# Patient Record
Sex: Female | Born: 1935 | Race: White | Hispanic: No | Smoking: Never smoker
Health system: Southern US, Community
[De-identification: ages and names within clinical notes are randomized; demographics above are authoritative.]

## PROBLEM LIST (undated history)

## (undated) DIAGNOSIS — Z950 Presence of cardiac pacemaker: Secondary | ICD-10-CM

## (undated) DIAGNOSIS — I10 Essential (primary) hypertension: Secondary | ICD-10-CM

## (undated) DIAGNOSIS — I071 Rheumatic tricuspid insufficiency: Secondary | ICD-10-CM

## (undated) DIAGNOSIS — M1990 Unspecified osteoarthritis, unspecified site: Secondary | ICD-10-CM

## (undated) DIAGNOSIS — F419 Anxiety disorder, unspecified: Secondary | ICD-10-CM

## (undated) DIAGNOSIS — T460X1A Poisoning by cardiac-stimulant glycosides and drugs of similar action, accidental (unintentional), initial encounter: Secondary | ICD-10-CM

## (undated) DIAGNOSIS — I495 Sick sinus syndrome: Secondary | ICD-10-CM

## (undated) DIAGNOSIS — I951 Orthostatic hypotension: Secondary | ICD-10-CM

## (undated) DIAGNOSIS — R609 Edema, unspecified: Secondary | ICD-10-CM

## (undated) DIAGNOSIS — I4891 Unspecified atrial fibrillation: Secondary | ICD-10-CM

## (undated) DIAGNOSIS — J449 Chronic obstructive pulmonary disease, unspecified: Secondary | ICD-10-CM

## (undated) HISTORY — DX: Unspecified atrial fibrillation: I48.91

## (undated) HISTORY — DX: Unspecified osteoarthritis, unspecified site: M19.90

## (undated) HISTORY — DX: Essential (primary) hypertension: I10

## (undated) HISTORY — DX: Presence of cardiac pacemaker: Z95.0

## (undated) HISTORY — PX: TOTAL HIP ARTHROPLASTY: SHX124

## (undated) HISTORY — DX: Sick sinus syndrome: I49.5

## (undated) HISTORY — DX: Rheumatic tricuspid insufficiency: I07.1

## (undated) HISTORY — DX: Edema, unspecified: R60.9

## (undated) HISTORY — DX: Poisoning by cardiac-stimulant glycosides and drugs of similar action, accidental (unintentional), initial encounter: T46.0X1A

## (undated) HISTORY — DX: Orthostatic hypotension: I95.1

## (undated) HISTORY — DX: Anxiety disorder, unspecified: F41.9

## (undated) HISTORY — DX: Chronic obstructive pulmonary disease, unspecified: J44.9

---

## 1998-11-09 ENCOUNTER — Encounter: Payer: Self-pay | Admitting: Orthopaedic Surgery

## 1998-11-09 ENCOUNTER — Inpatient Hospital Stay (HOSPITAL_COMMUNITY): Admission: RE | Admit: 1998-11-09 | Discharge: 1998-11-14 | Payer: Self-pay | Admitting: Orthopaedic Surgery

## 1999-05-02 HISTORY — PX: BACK SURGERY: SHX140

## 1999-07-25 ENCOUNTER — Encounter: Admission: RE | Admit: 1999-07-25 | Discharge: 1999-07-25 | Payer: Self-pay | Admitting: Orthopaedic Surgery

## 1999-07-25 ENCOUNTER — Encounter: Payer: Self-pay | Admitting: Orthopaedic Surgery

## 2006-05-01 HISTORY — PX: PACEMAKER INSERTION: SHX728

## 2006-10-10 ENCOUNTER — Encounter: Admission: RE | Admit: 2006-10-10 | Discharge: 2006-10-10 | Payer: Self-pay | Admitting: Internal Medicine

## 2006-10-31 ENCOUNTER — Ambulatory Visit: Payer: Self-pay | Admitting: Cardiology

## 2006-10-31 ENCOUNTER — Inpatient Hospital Stay (HOSPITAL_COMMUNITY): Admission: EM | Admit: 2006-10-31 | Discharge: 2006-11-08 | Payer: Self-pay | Admitting: Emergency Medicine

## 2006-11-01 ENCOUNTER — Encounter (INDEPENDENT_AMBULATORY_CARE_PROVIDER_SITE_OTHER): Payer: Self-pay | Admitting: Internal Medicine

## 2006-11-10 ENCOUNTER — Emergency Department (HOSPITAL_COMMUNITY): Admission: EM | Admit: 2006-11-10 | Discharge: 2006-11-10 | Payer: Self-pay | Admitting: Emergency Medicine

## 2006-11-26 ENCOUNTER — Ambulatory Visit: Payer: Self-pay | Admitting: Cardiology

## 2006-11-26 ENCOUNTER — Ambulatory Visit: Payer: Self-pay

## 2006-12-06 ENCOUNTER — Ambulatory Visit: Payer: Self-pay | Admitting: Cardiology

## 2007-01-18 ENCOUNTER — Ambulatory Visit: Payer: Self-pay | Admitting: Cardiology

## 2007-01-23 ENCOUNTER — Ambulatory Visit: Payer: Self-pay | Admitting: Cardiology

## 2007-02-12 ENCOUNTER — Ambulatory Visit: Payer: Self-pay | Admitting: Cardiology

## 2007-02-26 ENCOUNTER — Ambulatory Visit: Payer: Self-pay | Admitting: Internal Medicine

## 2007-03-13 ENCOUNTER — Ambulatory Visit: Payer: Self-pay | Admitting: Cardiology

## 2007-03-13 ENCOUNTER — Ambulatory Visit: Payer: Self-pay | Admitting: Internal Medicine

## 2007-03-13 LAB — CONVERTED CEMR LAB
CO2: 31 meq/L (ref 19–32)
Creatinine, Ser: 1 mg/dL (ref 0.4–1.2)
Potassium: 4.7 meq/L (ref 3.5–5.1)
Sodium: 136 meq/L (ref 135–145)

## 2007-11-05 ENCOUNTER — Ambulatory Visit: Payer: Self-pay | Admitting: Internal Medicine

## 2008-02-02 ENCOUNTER — Encounter: Payer: Self-pay | Admitting: Cardiology

## 2008-02-03 ENCOUNTER — Ambulatory Visit: Payer: Self-pay | Admitting: Cardiology

## 2008-02-05 ENCOUNTER — Ambulatory Visit: Payer: Self-pay | Admitting: Internal Medicine

## 2008-05-06 ENCOUNTER — Ambulatory Visit: Payer: Self-pay | Admitting: Internal Medicine

## 2008-05-29 ENCOUNTER — Encounter (INDEPENDENT_AMBULATORY_CARE_PROVIDER_SITE_OTHER): Payer: Self-pay | Admitting: *Deleted

## 2008-08-05 ENCOUNTER — Ambulatory Visit: Payer: Self-pay | Admitting: Internal Medicine

## 2008-10-20 ENCOUNTER — Telehealth (INDEPENDENT_AMBULATORY_CARE_PROVIDER_SITE_OTHER): Payer: Self-pay | Admitting: *Deleted

## 2008-11-19 DIAGNOSIS — I4891 Unspecified atrial fibrillation: Secondary | ICD-10-CM | POA: Insufficient documentation

## 2008-11-19 DIAGNOSIS — M159 Polyosteoarthritis, unspecified: Secondary | ICD-10-CM

## 2008-11-19 DIAGNOSIS — J4489 Other specified chronic obstructive pulmonary disease: Secondary | ICD-10-CM | POA: Insufficient documentation

## 2008-11-19 DIAGNOSIS — J449 Chronic obstructive pulmonary disease, unspecified: Secondary | ICD-10-CM

## 2008-11-22 IMAGING — US US RENAL
1 series · 14 of 25 positions shown · non-contrast
Comparison: None.

CLINICAL DATA: Recurrent bladder infections.
RENAL/URINARY TRACT ULTRASOUND:
TECHNIQUE: Complete ultrasound of the urinary tract was performed including evaluation of the kidney, renal collecting systems, and urinary bladder.

[Series 1: us renal · 0.22mm/px · 14 of 34 slices shown]
[im 1/34]
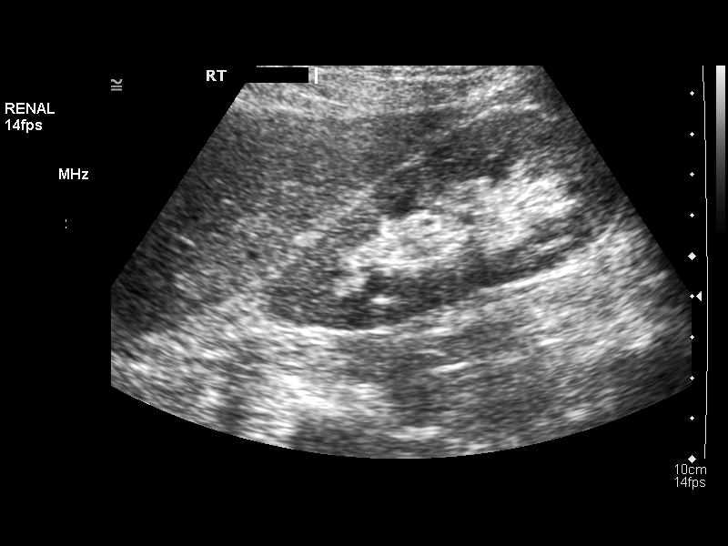
[im 3/34]
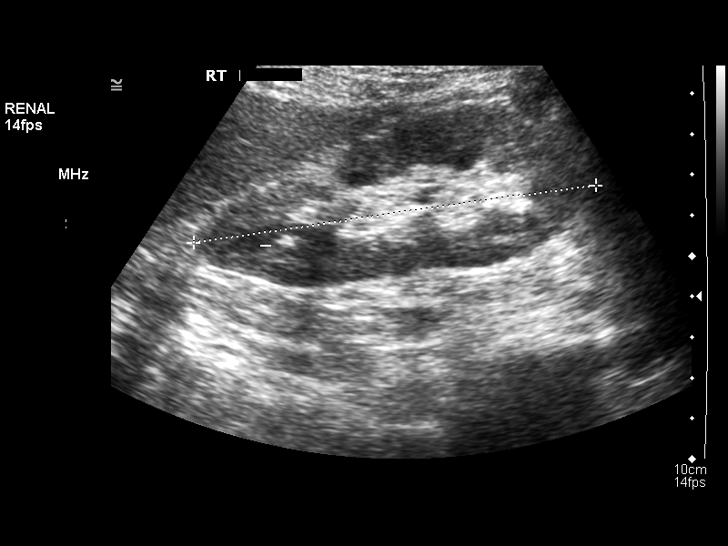
[im 6/34]
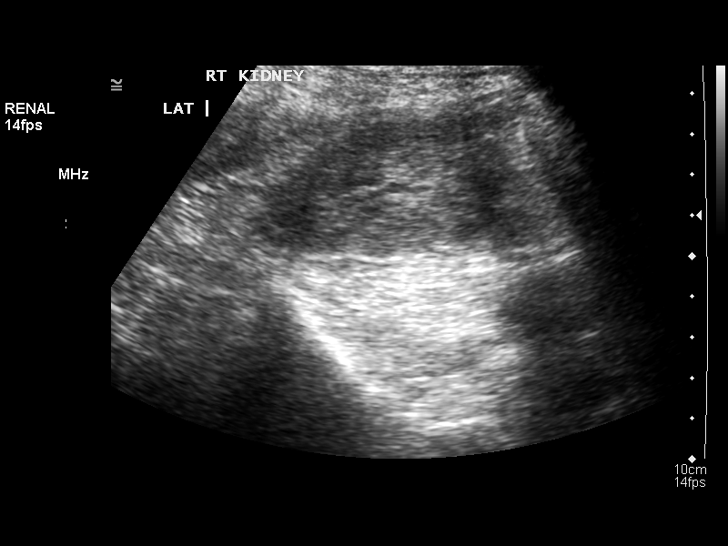
[im 9/34]
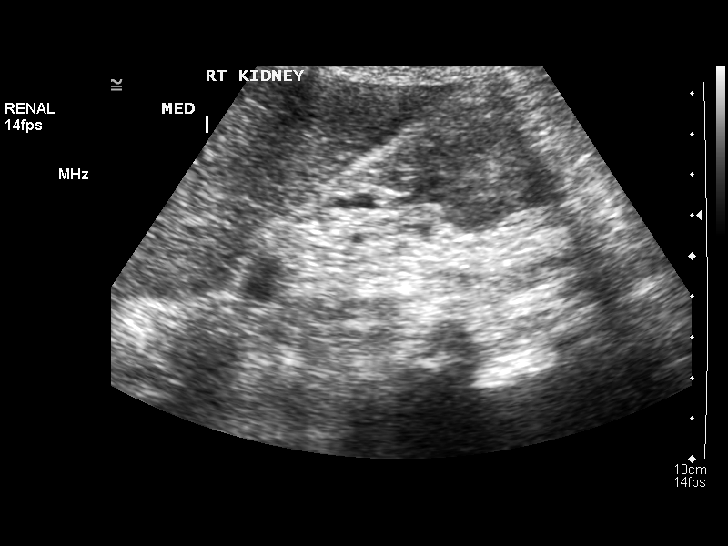
[im 12/34]
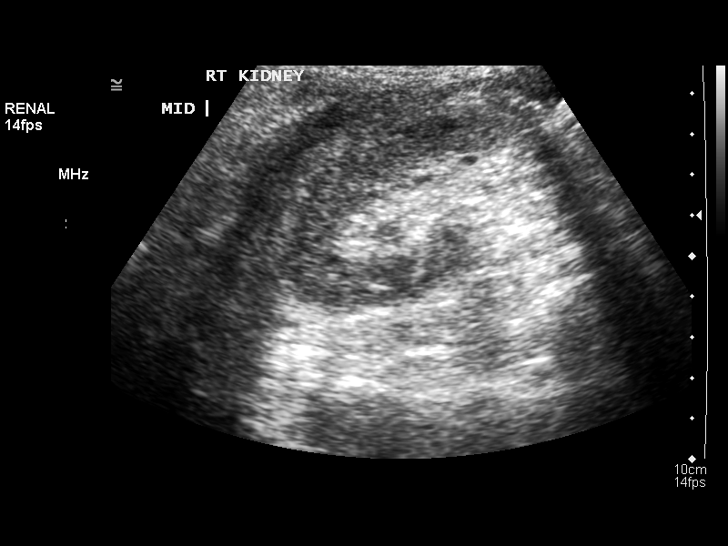
[im 13/34]
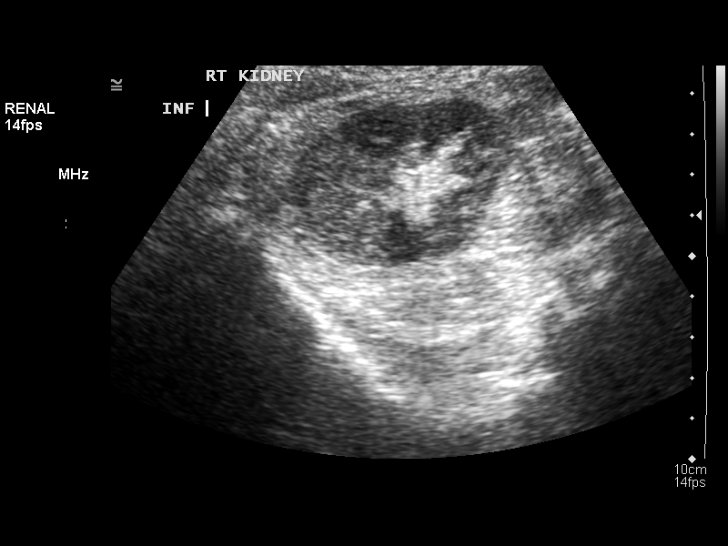
[im 16/34]
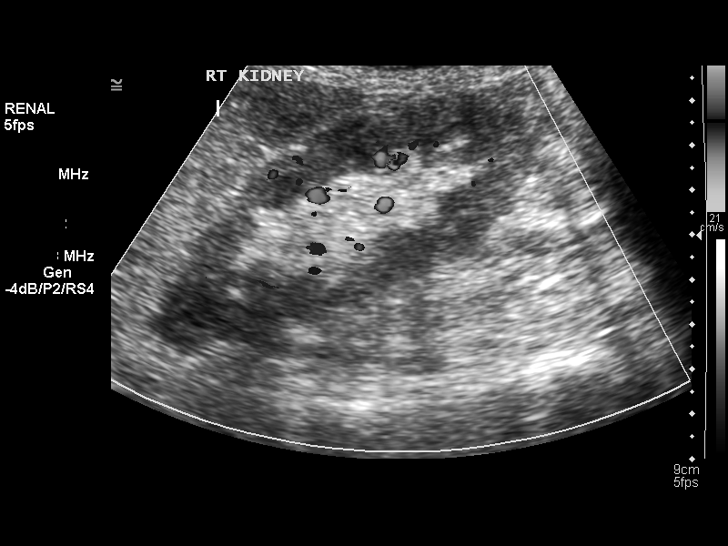
[im 18/34]
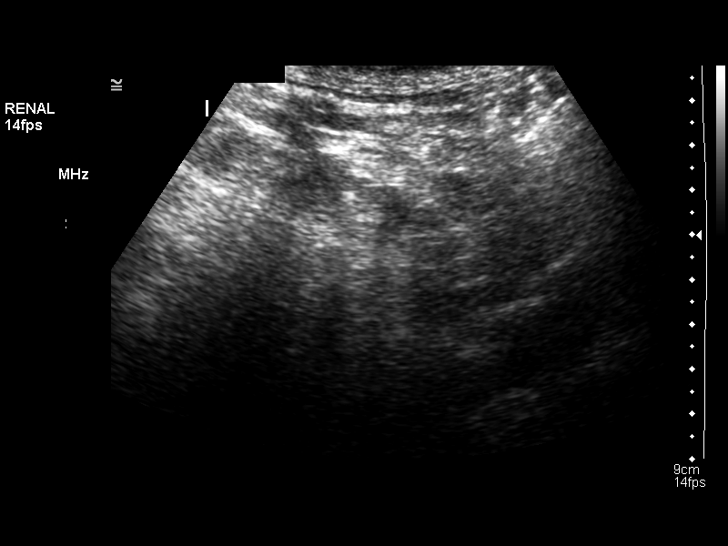
[im 21/34]
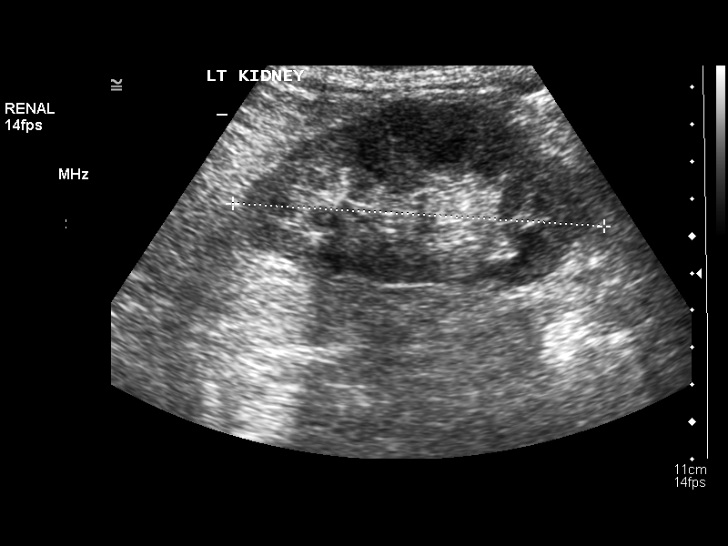
[im 23/34]
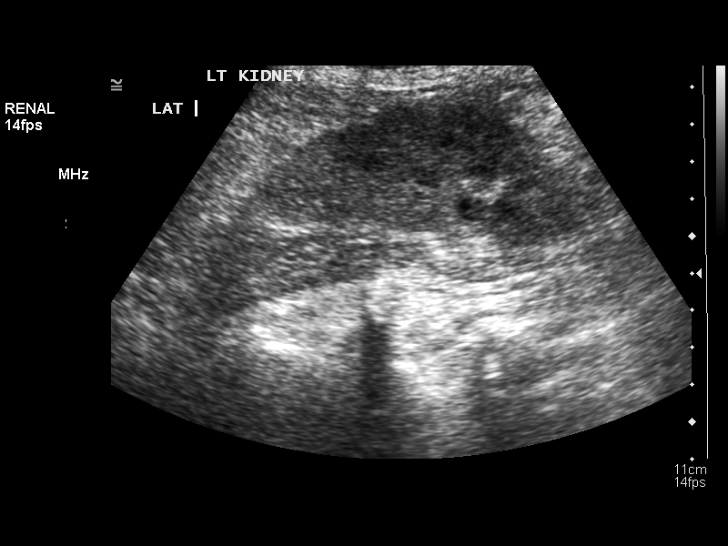
[im 25/34]
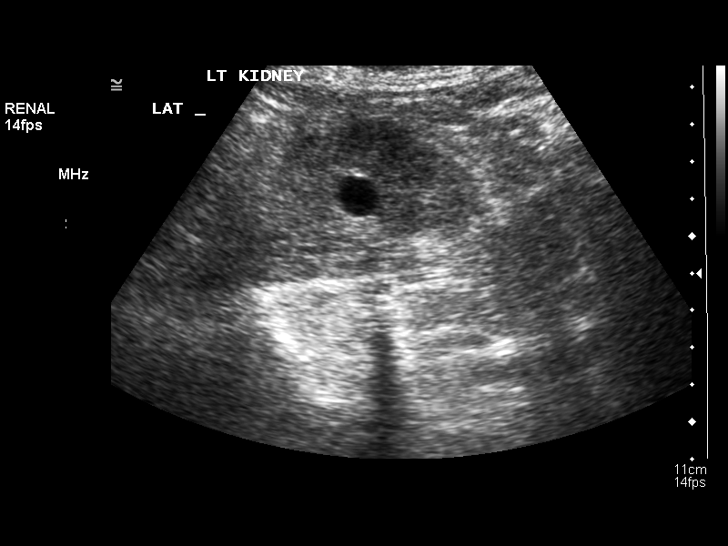
[im 28/34]
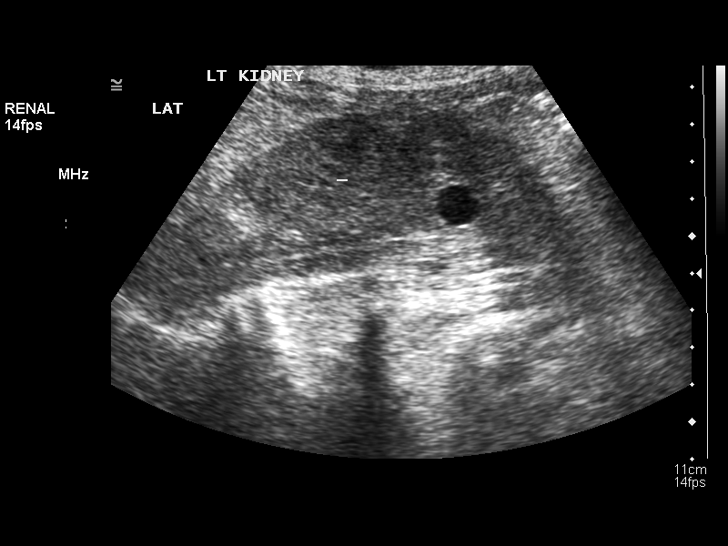
[im 31/34]
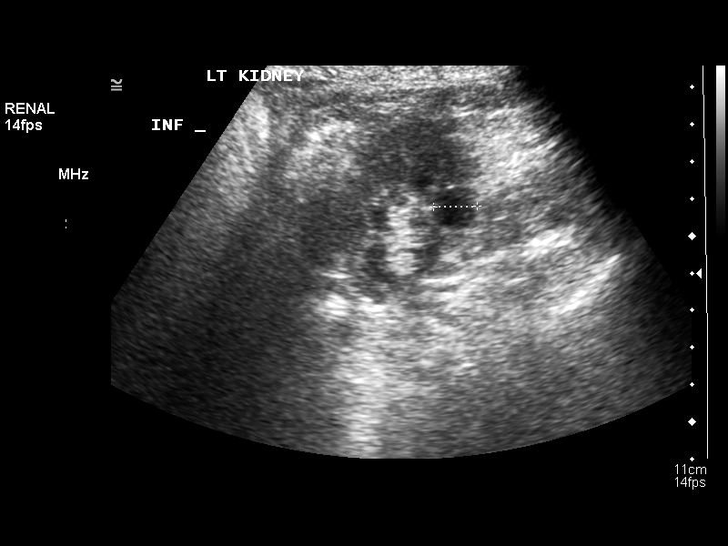
[im 34/34]
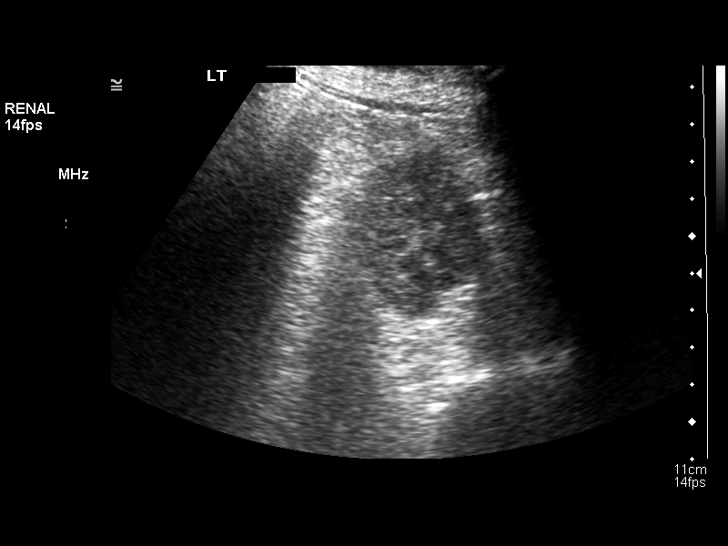

[14 of 25 positions shown; findings below may reference images not displayed]

FINDINGS: Each kidney measures 10 cm in length without hydronephrosis.  Right kidney with two cysts.  Along the upper pole region, a cyst measures 2.2 x 1.6 x 1.4 cm.  Along the lower pole region, cyst measures 1.1 x 1.7 x 1.5 cm.  Left lower pole cyst measures 1.4 x 1.2 x 1.2 cm. 
Bladder is decompressed as the patient had to void prior to the exam and evaluation of the bladder is limited.
IMPRESSION: No evidence of hydronephrosis involving either kidney.  Bilateral renal cysts are noted, as described above.

## 2008-12-23 IMAGING — CT CT HEAD W/O CM
1 of 2 series · 13 of 30 positions shown, 17 images · IV contrast (agent unspecified)
Comparison: none

CLINICAL DATA: Atrial fibrillation. Dizziness. Pacemaker. 
HEAD CT WITHOUT CONTRAST:
TECHNIQUE: Contiguous axial images were obtained from the base of the skull through the vertex according to standard protocol without contrast.

[Series 2: brain · axial · 0.47mm/px · z∈[+137,+265]mm · 13 of 28 slices shown, 17 images]
[im 2/28  brain]
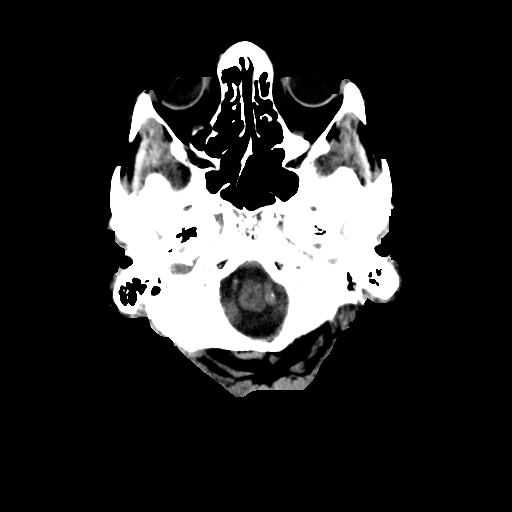
[im 2/28  bone]
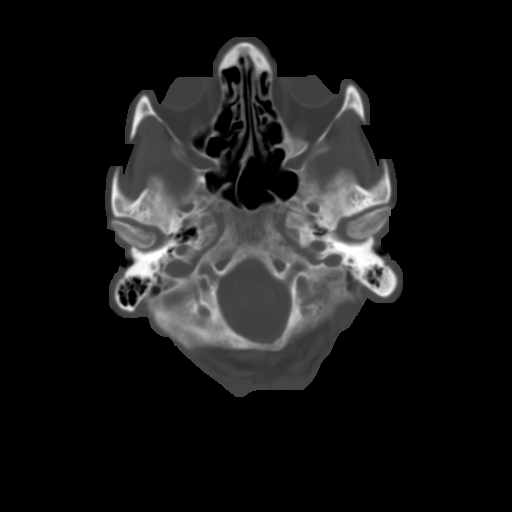
[im 4/28  brain]
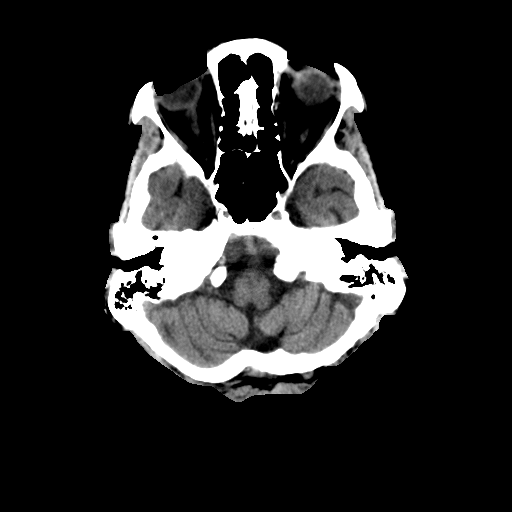
[im 6/28  brain]
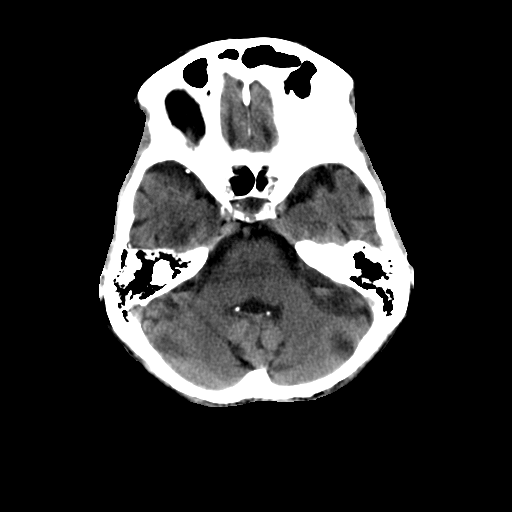
[im 8/28  brain]
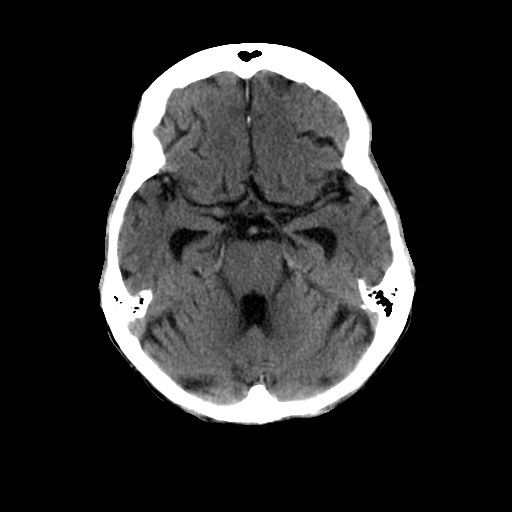
[im 10/28  brain]
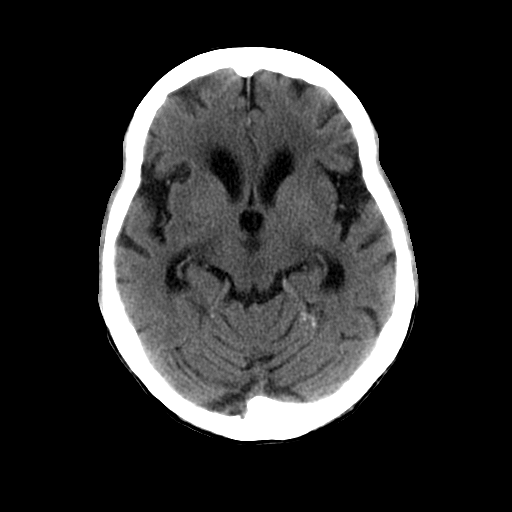
[im 10/28  bone]
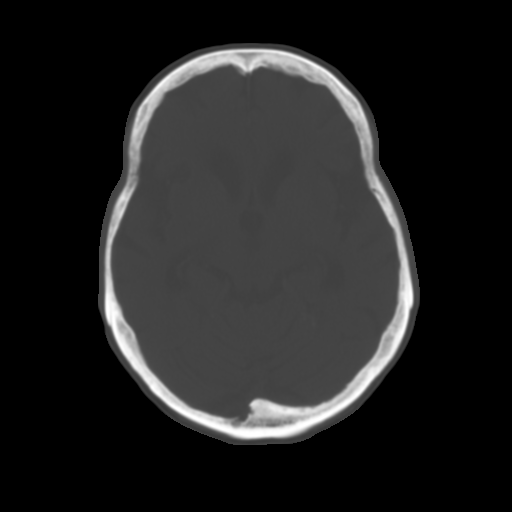
[im 12/28  brain]
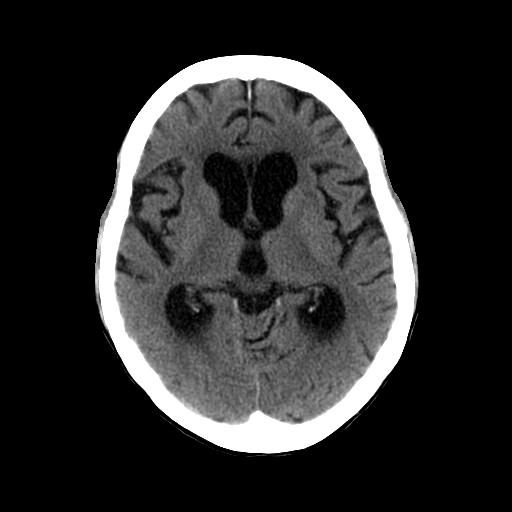
[im 14/28  brain]
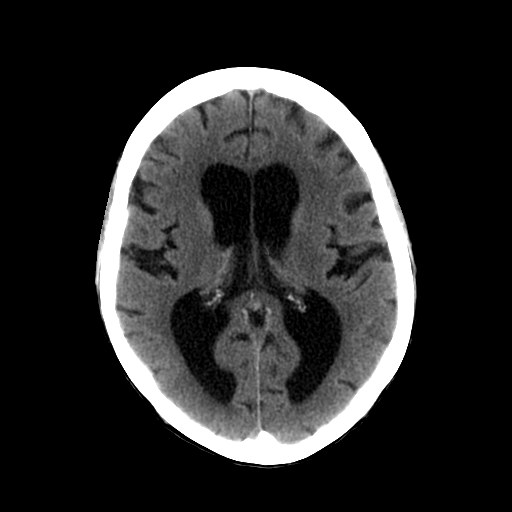
[im 16/28  brain]
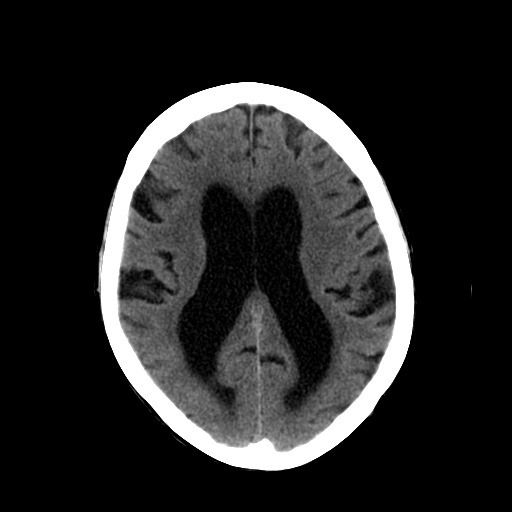
[im 18/28  brain]
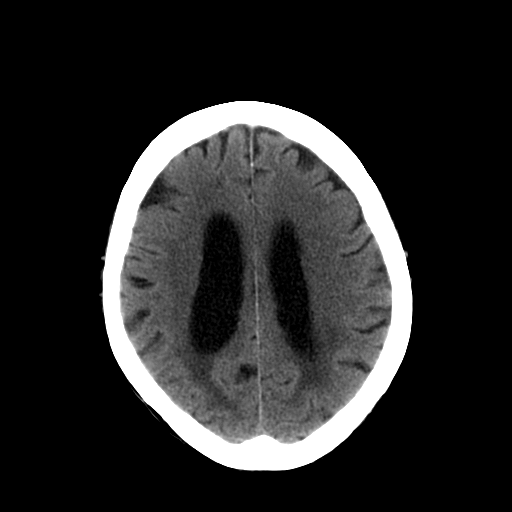
[im 18/28  bone]
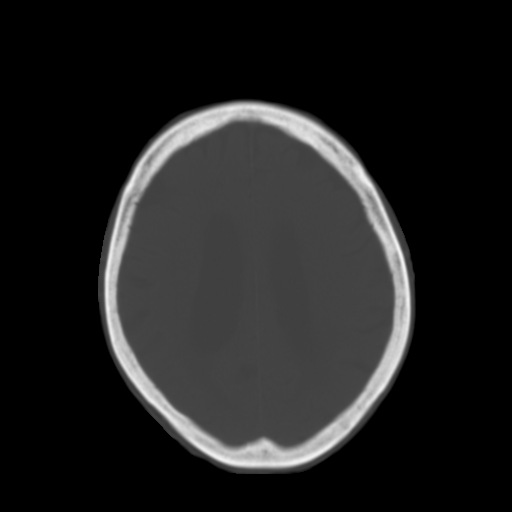
[im 20/28  brain]
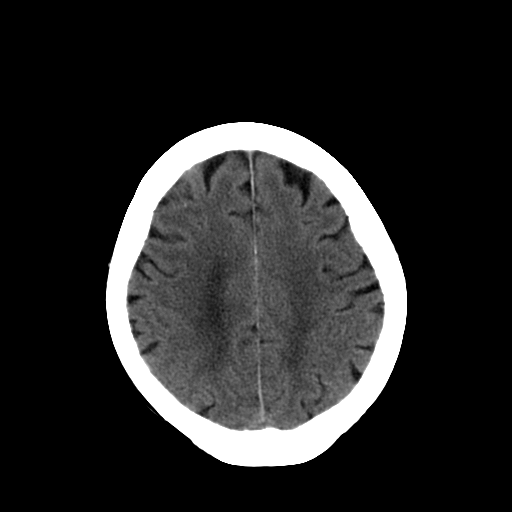
[im 22/28  brain]
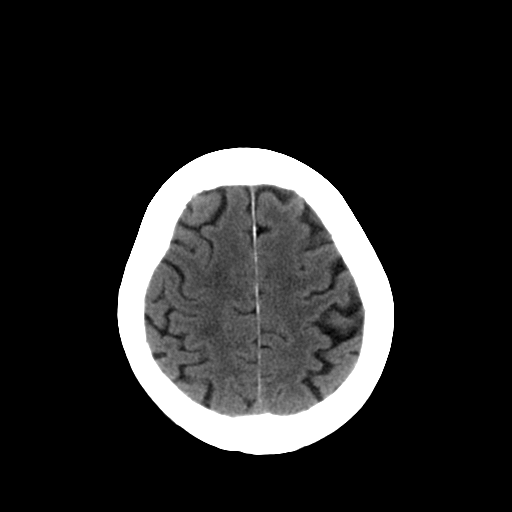
[im 24/28  brain]
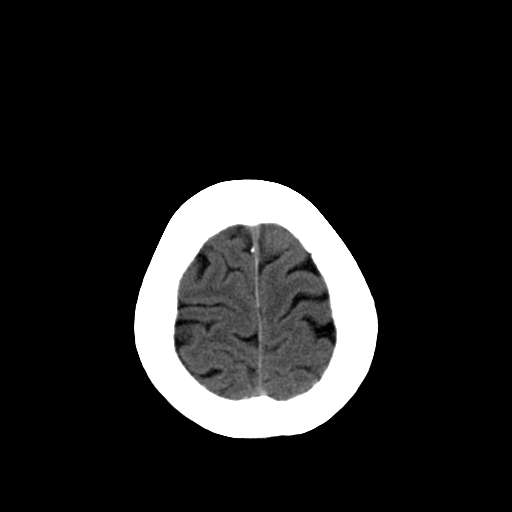
[im 26/28  brain]
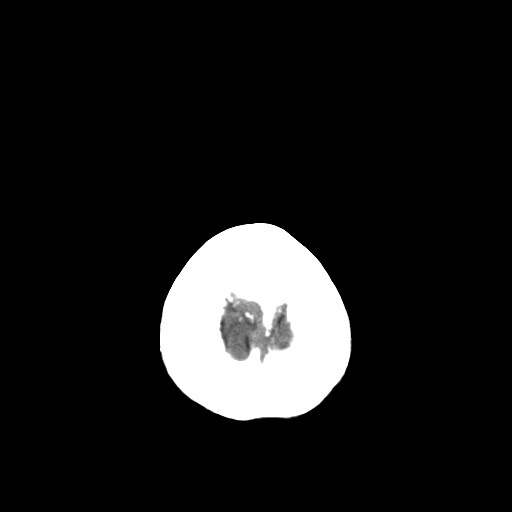
[im 26/28  bone]
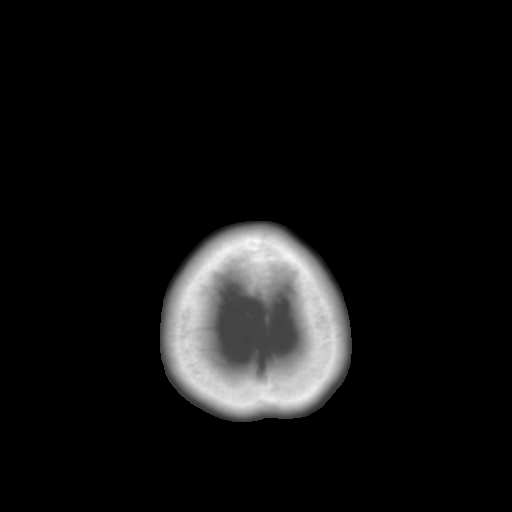

[13 of 30 positions shown; findings below may reference images not displayed]

FINDINGS: As noted previously, there is diffuse ventricular dilatation with enlarged temporal horns. Prominence of the cerebral sulci, fissures, and basilar cisterns and also the cerebellar folia suggesting that the ventricular dilatation is due to atrophy. I cannot exclude the possibility of normal pressure hydrocephalus. Findings compatible with chronic small vessel disease of the cerebral deep white matter mainly involving the posterior parietal lobes. Negative for hemorrhage, mass or acute infarction.
IMPRESSION: Diffuse ventriculomegaly-see comments above. No acute findings. Chronic small vessel disease of the cerebral white matter. No interval change.

## 2008-12-28 ENCOUNTER — Encounter: Payer: Self-pay | Admitting: Cardiology

## 2008-12-28 DIAGNOSIS — R609 Edema, unspecified: Secondary | ICD-10-CM

## 2008-12-28 DIAGNOSIS — Z950 Presence of cardiac pacemaker: Secondary | ICD-10-CM

## 2008-12-28 DIAGNOSIS — I10 Essential (primary) hypertension: Secondary | ICD-10-CM | POA: Insufficient documentation

## 2008-12-29 ENCOUNTER — Ambulatory Visit: Payer: Self-pay | Admitting: Cardiology

## 2009-01-26 ENCOUNTER — Ambulatory Visit: Payer: Self-pay | Admitting: Internal Medicine

## 2009-04-28 ENCOUNTER — Ambulatory Visit: Payer: Self-pay | Admitting: Internal Medicine

## 2009-05-06 ENCOUNTER — Encounter: Payer: Self-pay | Admitting: Internal Medicine

## 2009-05-20 ENCOUNTER — Telehealth: Payer: Self-pay | Admitting: Internal Medicine

## 2009-07-28 ENCOUNTER — Ambulatory Visit: Payer: Self-pay | Admitting: Internal Medicine

## 2009-07-29 ENCOUNTER — Encounter: Payer: Self-pay | Admitting: Internal Medicine

## 2009-08-02 ENCOUNTER — Encounter: Payer: Self-pay | Admitting: Internal Medicine

## 2009-10-27 ENCOUNTER — Ambulatory Visit: Payer: Self-pay | Admitting: Internal Medicine

## 2009-11-17 ENCOUNTER — Encounter: Payer: Self-pay | Admitting: Internal Medicine

## 2010-01-05 ENCOUNTER — Ambulatory Visit: Payer: Self-pay | Admitting: Cardiology

## 2010-01-10 ENCOUNTER — Telehealth: Payer: Self-pay | Admitting: Cardiology

## 2010-04-07 ENCOUNTER — Ambulatory Visit: Payer: Self-pay | Admitting: Internal Medicine

## 2010-04-15 ENCOUNTER — Encounter: Payer: Self-pay | Admitting: Internal Medicine

## 2010-05-29 LAB — CONVERTED CEMR LAB
Basophils Absolute: 0 10*3/uL (ref 0.0–0.1)
CO2: 27 meq/L (ref 19–32)
Calcium: 9.5 mg/dL (ref 8.4–10.5)
Creatinine, Ser: 1.1 mg/dL (ref 0.4–1.2)
Eosinophils Absolute: 0 10*3/uL (ref 0.0–0.7)
Glucose, Bld: 112 mg/dL — ABNORMAL HIGH (ref 70–99)
Lymphocytes Relative: 17.2 % (ref 12.0–46.0)
MCHC: 34.1 g/dL (ref 30.0–36.0)
Neutrophils Relative %: 71.8 % (ref 43.0–77.0)
RDW: 14.9 % — ABNORMAL HIGH (ref 11.5–14.6)

## 2010-05-31 NOTE — Letter (Signed)
Summary: Remote Device Check  [employer], Main Office  [address]   [place], [place] [address]   Phone: [phone]  Fax: [phone]     August 02, 2009 MRN: [id number]   [facility] [address] [place], [place]  [address]   Dear [name],   Your remote transmission was recieved and reviewed by your physician.  All diagnostics were within normal limits for you.  __X___Your next transmission is scheduled for:  October 27, 2009.  Please transmit at any time this day.  If you have a wireless device your transmission will be sent automatically.     Sincerely,  Proofreader

## 2010-05-31 NOTE — Letter (Signed)
Summary: Remote Device Check  [employer], Main Office  [address]   [place], [place] [address]   Phone: [phone]  Fax: [phone]     May 06, 2009 MRN: [id number]   [facility] [address] [place], [place]  [address]   Dear Ms. Whorley,   Your remote transmission was recieved and reviewed by your physician.  All diagnostics were within normal limits for you.  __X___Your next transmission is scheduled for:   July 28, 2009.  Please transmit at any time this day.  If you have a wireless device your transmission will be sent automatically.     Sincerely,  Proofreader

## 2010-05-31 NOTE — Progress Notes (Signed)
Summary: re bp readings  Phone Note Call from Patient   Caller: Daughter Brandi [name] [phone] Reason for Call: Talk to Nurse Summary of Call: pt was to have bp readings called to nurse today-machine is broken-wanted to talk with nurse-pls call [phone] dtr Brandi [name] Initial call taken by: Glynda Jaeger,  January 10, 2010 4:42 PM  Follow-up for Phone Call        spoke w/Jackie she states pt has been upset since she was told to check her BP at home and every time daughter has tried to check it pt has became very agitated and upset and machine wouldn't work properly, pt has stated she didn't want it checked anymore and has refused to allow daughter to check it.  She does state pt has seemed to be ok and hasn't compained of anything, she does have an upcoming appt w/Dr Graciela Husbands advised we would check her BP at that time [name], RN  January 10, 2010 5:28 PM

## 2010-05-31 NOTE — Cardiovascular Report (Signed)
Summary: Office Visit Remote   Office Visit Remote   Imported By: Roderic Ovens 08/10/2009 16:41:20  _____________________________________________________________________  External Attachment:    Type:   Image     Comment:   External Document

## 2010-05-31 NOTE — Progress Notes (Signed)
Summary: dose pt need pre meds for dental appt  Phone Note Call from Patient Call back at Home Phone [phone]   Caller: Daughter / Olegario Messier Reason for Call: Talk to Nurse Summary of Call: pt has dental appt Monday at 10:15 and needs to know if patient needs to be medicated Initial call taken by: Omer Jack,  May 20, 2009 9:01 AM  Follow-up for Phone Call        pt does not need antibiotics, pts daughter is aware [name], RN  May 20, 2009 9:31 AM

## 2010-05-31 NOTE — Cardiovascular Report (Signed)
Summary: Office Visit Remote   Office Visit Remote   Imported By: Roderic Ovens 05/12/2009 14:56:34  _____________________________________________________________________  External Attachment:    Type:   Image     Comment:   External Document

## 2010-05-31 NOTE — Letter (Signed)
Summary: Remote Device Check  [employer], Main Office  [address]   [place], [place] [address]   Phone: [phone]  Fax: [phone]     November 17, 2009 MRN: [id number]   [facility] [address] [place], [place]  [address]   Dear Ms. Reth,   Your remote transmission was recieved and reviewed by your physician.  All diagnostics were within normal limits for you.   ___X___Your next office visit is scheduled for: September with Dr Graciela Husbands. Please call our office to schedule an appointment.    Sincerely,  Vella Kohler

## 2010-05-31 NOTE — Assessment & Plan Note (Signed)
Summary: f1y   Visit Type:  Follow-up Visit Primary Provider:  None  CC:  FLUID.  History of Present Illness: The patient is seen for cardiology followup.  She has known chronic atrial fibrillation.  There is a pacemaker in place.  This was assessed for only one year ago.  She has a history of some chronic edema with question of some relationship to Norvasc.  She does take a small dose of diuretics.  Recently she had not received any diuretics and she had some increased swelling.  She then had some fluid leak from her right leg.  The family was concerned and she is here for followup.  Fluid discharge stopped and she is stable now.  With a few doses of Lasix her legs are back to normal.  She's not having chest pain or shortness of breath.  Current Medications (verified): 1)  Alprazolam 0.5 Mg Tabs (Alprazolam) .... Take One Tablet At Bedtime 2)  Amlodipine Besylate 5 Mg Tabs (Amlodipine Besylate) .... Take 1 Tablet By Mouth Once A Day 3)  Furosemide 20 Mg Tabs (Furosemide) .... Take 1/2 Tablet By Mouth Once A Day 4)  Metoprolol Tartrate 100 Mg Tabs (Metoprolol Tartrate) .... Take 1 Tablet By Mouth Twice A Day  Allergies (verified): 1)  ! Sulfa 2)  Codeine Phosphate (Codeine Phosphate) 3)  Penicillin V Potassium (Penicillin V Potassium)  Past History:  Past Medical History: Atrial fibrillation-permanent Coumadin    refused repeatedly in the past Pacemaker-Medtronic Adapta.Marland KitchenMarland KitchenJuly, 2008...brady-tachy....syncope  (DR.KLEIN) Digoxin toxicity osteoarthritis Anxiety Hypertension/orthostasis COPD..... biapical pleural thickening  EF  70%.... echo... July, 2008 Tricuspid regurgitation    moderate.... echo... July, 2008 Edema....chronic...Marland Kitchen?? Norvasc?? fluid leakage from right leg with increased edema..... September, 2011  Review of Systems       Patient denies fever, chills, headache, sweats, rash, change in vision, change in hearing, chest pain, cough, nausea vomiting, urinary  symptoms.  All other systems are reviewed and are negative.  Vital Signs:  Patient profile:   77 year old female Height:      69 inches Weight:      160 pounds Pulse rate:   71 / minute BP sitting:   167 / 98  (left arm) Cuff size:   regular  Vitals Entered By: Burnett Kanaris, CNA (January 05, 2010 9:02 AM)  Physical Exam  General:  patient is stable today.  She is sitting in a wheelchair. Head:  head is atraumatic. Eyes:  no xanthelasma. Neck:  no jugular venous distention. Chest Wall:  no chest wall tenderness. Lungs:  lungs are clear.  Respiratory effort is nonlabored. Heart:  cardiac exam reveals an S1-S2.  There is no clicks or significant murmurs.  The rhythm is irregularly irregular. Abdomen:  abdomen is soft. Msk:  no musculoskeletal deformities. Extremities:  trace peripheral edema in the lower extremities. Skin:  no skin rash. Psych:  patient is oriented to person time and place.  Affect is normal.  She is here with 2 daughters.   PPM Specifications Following MD:  Sherryl Manges, MD     PPM Vendor:  Medtronic     PPM Model Number:  [id number]     PPM Serial Number:  [id number] PPM DOI:  11/06/2006     PPM Implanting MD:  Sherryl Manges, MD  Lead 1    Location: RV     DOI: 11/06/2006     Model #: [id number]     Serial #: [id number]     Status: active  Magnet  Response Rate:  BOL 85 ERI 65  Indications:  Tachy-brady syndrome   PPM Follow Up Pacer Dependent:  No      Episodes Coumadin:  No  Parameters Mode:  VVIR     Lower Rate Limit:  60     Upper Rate Limit:  130  Impression & Recommendations:  Problem # 1:  * COUMADIN.... REFUSED REPEATEDLY The patient has atrial fibrillation.  She has repeatedly refused Coumadin over the years.  Problem # 2:  EDEMA (ICD-782.3)  The patient has some chronic edema in her legs.  More recently she had some increased when her diuretic wasn't given.  She then had a leakage of fluid from her right leg.  This has stabilized and does not  represent a significant problem.  She will stay on Lasix now 3 days weekly.  Orders: TLB-BMP (Basic Metabolic Panel-BMET) (80048-METABOL) TLB-CBC Platelet - w/Differential (85025-CBCD)  Problem # 3:  HYPERTENSION (ICD-401.9)  Her updated medication list for this problem includes:    Amlodipine Besylate 5 Mg Tabs (Amlodipine besylate) .Marland Kitchen... Take 1 tablet by mouth once a day    Furosemide 20 Mg Tabs (Furosemide) .Marland Kitchen... Take 1/2 tablet by mouth once a day    Metoprolol Tartrate 100 Mg Tabs (Metoprolol tartrate) .Marland Kitchen... Take 1 tablet by mouth twice a day Blood pressure is elevated today.  We'll arrange for blood pressure followup.  If her pressure remains elevated we will either try to increase her amlodipine again or start another medication.  There was question of edema from the higher dose amlodipine before.  Orders: TLB-BMP (Basic Metabolic Panel-BMET) (80048-METABOL)  Problem # 4:  PACEMAKER, PERMANENT (ICD-V45.01) the patient has permanent atrial fibrillation.  Her pacemaker will be assessed with her followup visit with Dr. Graciela Husbands. EKG is done today and reviewed by me.  She has old RSR prime in V1.  The rhythm is probably atrial fib.  Other Orders: EKG w/ Interpretation (93000)  Patient Instructions: 1)  Your physician recommends that you schedule a follow-up appointment in: 6 months. The office will mail you a reminder letter 2 months prior appointment date. 2)  Your physician recommends that you return for lab work in: Today BMET, CBC. 3)  Need to make an appointment with Dr. Graciela Husbands f/u pacemaker. 4)   Family to take pt's B/P x2 and call it in to office. 5)  Your physician recommends that you continue on your current medications as directed. Please refer to the Current Medication list given to you today.

## 2010-05-31 NOTE — Cardiovascular Report (Signed)
Summary: Office Visit Remote   Office Visit Remote   Imported By: Roderic Ovens 11/18/2009 11:05:22  _____________________________________________________________________  External Attachment:    Type:   Image     Comment:   External Document

## 2010-06-02 NOTE — Cardiovascular Report (Signed)
Summary: Office Visit Remote   Office Visit Remote   Imported By: Roderic Ovens 04/18/2010 16:01:10  _____________________________________________________________________  External Attachment:    Type:   Image     Comment:   External Document

## 2010-06-02 NOTE — Letter (Signed)
Summary: Remote Device Check  [employer], Main Office  [address]   [place], [place] [address]   Phone: [phone]  Fax: [phone]     April 15, 2010 MRN: [id number]   [facility] [address] [place], [place]  [address]   Dear Ms. Deridder,   Your remote transmission was recieved and reviewed by your physician.  All diagnostics were within normal limits for you.   __X____Your next office visit is scheduled for: 07-26-10 @1200 .   Sincerely,  Vella Kohler

## 2010-07-12 ENCOUNTER — Encounter: Payer: Self-pay | Admitting: Internal Medicine

## 2010-07-18 ENCOUNTER — Ambulatory Visit (INDEPENDENT_AMBULATORY_CARE_PROVIDER_SITE_OTHER): Payer: Medicare Other | Admitting: Cardiology

## 2010-07-18 ENCOUNTER — Encounter: Payer: Self-pay | Admitting: Cardiology

## 2010-07-18 DIAGNOSIS — I10 Essential (primary) hypertension: Secondary | ICD-10-CM

## 2010-07-26 ENCOUNTER — Encounter: Payer: Self-pay | Admitting: Internal Medicine

## 2010-07-28 NOTE — Assessment & Plan Note (Signed)
Summary: follow up/per ck out/sl/kl   Visit Type:  Follow-up Primary Provider:  None  CC:  hypertension.  History of Present Illness: The patient is seen for followup of hypertension, atrial fibrillation, Coumadin therapy.  She is feeling relatively well.  She is not having chest pain.  She's not having any significant shortness of breath.  Historically there is been question that the edema has increased with higher dose of amlodipine.  However her blood pressure is elevated today and we will make decisions about her meds.  Current Medications (verified): 1)  Alprazolam 0.25 Mg Tabs (Alprazolam) .... At Bedtime 2)  Amlodipine Besylate 5 Mg Tabs (Amlodipine Besylate) .... Take 1 Tablet By Mouth Once A Day 3)  Furosemide 20 Mg Tabs (Furosemide) .... Take 1/2 Tablet By Mouth Once A Day 4)  Metoprolol Tartrate 100 Mg Tabs (Metoprolol Tartrate) .... Take 1 Tablet By Mouth Twice A Day  Allergies (verified): 1)  ! Sulfa 2)  Codeine Phosphate (Codeine Phosphate) 3)  Penicillin V Potassium (Penicillin V Potassium)  Past History:  Past Medical History: Atrial fibrillation-permanent Coumadin    refused repeatedly in the past Pacemaker-Medtronic Adapta.Marland KitchenMarland KitchenJuly, 2008...brady-tachy....syncope  (DR.KLEIN) Digoxin toxicity osteoarthritis Anxiety Hypertension/orthostasis COPD..... biapical pleural thickening  EF  70%.... echo... July, 2008 Tricuspid regurgitation    moderate.... echo... July, 2008 Edema....chronic...Marland Kitchen?? Norvasc?? fluid leakage from right leg with increased edema..... September, 2011...  Review of Systems       Patient denies fever, chills, headache, sweats, rash, change in vision, change in hearing, chest pain, cough, nausea vomiting, urinary symptoms.  All the systems are reviewed and are negative.  Vital Signs:  Patient profile:   77 year old female Height:      69 inches Weight:      160 pounds BMI:     23.71 Pulse rate:   70 / minute BP sitting:   179 / 108   (right arm) Cuff size:   regular  Vitals Entered By: Hardin Negus, RMA (July 18, 2010 9:06 AM)  Physical Exam  General:  patient is stable today in a wheelchair. Eyes:  no xanthelasma. Neck:  no jugular venous distention. Lungs:  lungs are clear.  Respiratory effort is nonlabored. Heart:  cardiac exam reveals an S1-S2.  No clicks or significant murmurs. Abdomen:  abdomen is soft. Extremities:  she does not have any significant pitting edema today. Psych:  patient is oriented to person time and place.  Affect is normal.  She is here with her daughter today.  I believe she is having some memory issues.   PPM Specifications Following MD:  Sherryl Manges, MD     PPM Vendor:  Medtronic     PPM Model Number:  [id number]     PPM Serial Number:  [id number] PPM DOI:  11/06/2006     PPM Implanting MD:  Sherryl Manges, MD  Lead 1    Location: RV     DOI: 11/06/2006     Model #: [id number]     Serial #: [id number]     Status: active  Magnet Response Rate:  BOL 85 ERI 65  Indications:  Tachy-brady syndrome   PPM Follow Up Pacer Dependent:  No      Episodes Coumadin:  No  Parameters Mode:  VVIR     Lower Rate Limit:  60     Upper Rate Limit:  130  Impression & Recommendations:  Problem # 1:  EDEMA (ICD-782.3) We're careful to watch for edema.  She has significant  tissue in her lower extremities but no significant edema today.  Problem # 2:  HYPERTENSION (ICD-401.9) Blood pressure is elevated today.  Her pressures at home and been in the range of 160/90 according to her daughter.  I want to push her amlodipine higher.  We will dose 5 mg b.i.d.  If she has increased edema I will look for another drug.  I am not inclined to push her metoprolol to a higher dose.  Problem # 3:  FIBRILLATION, ATRIAL (ICD-427.31) Atrial fib rate is controlled.  EKG is done today and reviewed by me.  There is pacing and underlying QRS.  There is no change in the QRS when compared to the prior EKG.  No change in  therapy.  Other Orders: EKG w/ Interpretation (93000)  Patient Instructions: 1)  Increase Amlodipine to 5mg  two times a day  2)  Your physician wants you to follow-up in:  6 months.  You will receive a reminder letter in the mail two months in advance. If you don't receive a letter, please call our office to schedule the follow-up appointment. Prescriptions: AMLODIPINE BESYLATE 5 MG TABS (AMLODIPINE BESYLATE) Take 1 tablet by mouth two times a day  #60 x 6   Entered by:   [name], RN   Authorized by:   Talitha Givens, MD, [facility]   Signed by:   [name], RN on 07/18/2010   Method used:   Electronically to        [facility] [address] [id number]* (retail)       [address] [place], [place]  [address]       Ph: [phone]       Fax: [phone]   RxID:   [id number]

## 2010-08-16 ENCOUNTER — Encounter: Payer: Self-pay | Admitting: Internal Medicine

## 2010-09-13 NOTE — Assessment & Plan Note (Signed)
[facility]                                 ON-CALL NOTE   NAME:BELANEYDeneice, Wack                     MRN:          [id number]  DATE:11/20/2006                            DOB:          [birth year]    Ms. Dagley was discharged on November 08, 2006 after getting a pacemaker on  November 06, 2006.  She called this evening because she was concerned about  her pacer site.   Ms. Pitner states that she has some ecchymosis around her pacer site.  She states there is no swelling.  She states that can almost see the  edges of the device through her skin.  She states it is not tender to  touch.  There is no erythema or drainage.  There is no increased pain.   I advised Ms. Mathews that the only way to be evaluated now was to come  to the emergency room.  I advised her that if she would like to wait  until tomorrow, then I would call the office and leave them a message to  contact her to come in for a wound check.  She stated that she did not  think she was having any critical issues at this time, and although  there was more bruising there than she had noticed previously, she did  not see any swelling, and was not having any significant pain.  She  stated she would prefer to wait until tomorrow and be seen at the  office.      Theodore Demark, PA-C  Electronically Signed      Duke Salvia, MD, [facility]  Electronically Signed   RB/MedQ  DD: 11/20/2006  DT: 11/21/2006  Job #: [id number]

## 2010-09-13 NOTE — Assessment & Plan Note (Signed)
[facility]                            CARDIOLOGY OFFICE NOTE   NAME:[name] [name] MONTELONGO                     MRN:          [id number]  DATE:02/12/2007                            DOB:          [birth year]    [name] [name] is here today; she is here with her daughter.  Overall, she  is feeling well.  Her blood pressure is elevated.  She is quite anxious  when she comes here.  We are going to encourage her daughter to get a  home blood pressure cuff and call in some blood pressures because I  believe the pressures here are not helpful or reliable to [place].   PAST MEDICAL HISTORY:   ALLERGIES:  PENICILLIN and SULFA.   MEDICATIONS:  1. Metoprolol 100 mg b.i.d.  2. Xanax at night.  3. Amlodipine 5 mg daily.   OTHER MEDICAL PROBLEMS:  See the list on my note of December 06, 2006.   REVIEW OF SYSTEMS:  Overall, she is doing well.  Otherwise, Review of  Systems is negative.   PHYSICAL EXAMINATION:  VITAL SIGNS:  Blood pressure today here is 170/97  with a pulse of 78.  GENERAL:  The patient is oriented but quite anxious.  HEENT:  Reveals no xanthelasma.  She has normal extraocular motion.  NECK:  No carotid bruits, no jugular venous distention.  LUNGS:  Clear.  Respiratory effort is not labored.  CARDIAC:  Exam reveals S1 with S2.  There is a soft systolic murmur.  ABDOMEN:  Soft.  The patient does have some edema that is felt.  Her  daughter feels that there is no increase.  Her weight is increased  slightly.  I am not inclined to change her medicines.   PROBLEM LIST:  1. Status post permanent pacemaker.  2. Digoxin toxicity when she was first admitted to the hospital, and      then this was stopped.  3. Atrial fibrillation.  The rate is now controlled.  4. Tachycardia-bradycardia syndrome with pacemaker placed in July      2008.  5. Hypertension.  She also had some orthostasis.  Anxiety plays a      role.  We will obtain home blood pressures and try to  respond to      this with medication adjustment if needed.  6. Anxiety.  7. History of osteoarthritis.  8. Chronic diarrhea.  9. Status post hip replacement.  10.Status post urinary tract infections.  11.Episodes of syncope that has been treated with pacemaker.  12.Chronic obstructive pulmonary disease and biapical pleural      thickening.  13.Vigorous left ventricular function by echocardiogram.  14.Mild chronic peripheral edema. Some of the edema may be due to the      Norvasc.   Will check her blood pressure at home and respond to this, and I will  see her very infrequently in the office.     Brandi Abed, MD, [facility]  Electronically Signed    JDK/MedQ  DD: 02/12/2007  DT: 02/13/2007  Job #: [id number]   cc:   [name] [name], M.D.

## 2010-09-13 NOTE — Assessment & Plan Note (Signed)
[facility]                         ELECTROPHYSIOLOGY OFFICE NOTE   NAME:[name], Brandi HAFFEY                     MRN:          [id number]  DATE:11/26/2006                            DOB:          06/19/1919    REASON FOR VISIT:  Brandi [name] was seen today in the device clinic for  followup of her newly implanted Medtronic Adapta pacemaker, model  #ADSR01.  Brandi [name] device was implanted on November 06, 2006 for tachy-  brady syndrome.   CLINICAL DATA:  Interrogation of her device demonstrates R waves of 16  to 22.4 millivolts with an RV impedance of 757 ohms and a threshold of  0.5 volts at 0.4 milliseconds.  Her battery voltage was 2.79 volts. She  was in atrial fibrillation today.  She has had no ventricular high rate  episodes and is programmed VVIR with a low rate of 60 and upper rate of  130.  She is V pacing 26.6% of the time.   Brandi [name] Steri-Strips were removed.  Her wound is without redness  or swelling.  She will return to the clinic in October to see Dr. Graciela Husbands.  Upon discharge from the hospital she had been instructed to take her  Coumadin since she is in atrial fibrillation today.  Her and her  daughter both state that she does not want to take Coumadin secondary to  being a high fall risk and also because it made her sick after she took  a dose when leaving the hospital.  This will be discussed with Dr. Graciela Husbands  and if he wants to make any changes we will call the patient back by  telephone.      Gypsy Balsam, RN,BSN  Electronically Signed      Duke Salvia, MD, [facility]  Electronically Signed   AS/MedQ  DD: 11/26/2006  DT: 11/26/2006  Job #: [id number]

## 2010-09-13 NOTE — Discharge Summary (Signed)
Brandi [name], STROUGH              ACCOUNT NO.:  [id number]   MEDICAL RECORD NO.:  [id number]          PATIENT TYPE:  INP   LOCATION:  4732                         FACILITY:  [facility]   PHYSICIAN:  Hind I Elsaid, MD      DATE OF BIRTH:  07-Nov-1919   DATE OF ADMISSION:  10/31/2006  DATE OF DISCHARGE:  11/08/2006                               DISCHARGE SUMMARY   PRIMARY CARE PHYSICIAN:  Antony Madura, M.D.   CARDIOLOGIST:  Luis Abed, MD, [facility]   DISCHARGE DIAGNOSES:  1. Digoxin toxicity.  2. Atrial fibrillation.  3. Tachy-brady syndrome, status post single chamber pacemaker, placed      on November 06, 2006.  4. Hypertension, with a history of orthostasis during hospitalization.  5. Hyponatremia, resolved.  6. Anxiety.  7. History of osteoarthritis.  8. Chronic diarrhea.   DISCHARGE MEDICATIONS:  1. Toprol 75 mg p.o. q.a.m. and 50 mg p.o. at bedtime.  2. Coumadin 5 mg p.o. daily, to be followed at the [facility] on November 13, 2006.  3. Imodium 2 mg p.o. q.8h. p.r.n.  4. Xanax 0.25 mg p.o. daily p.r.n.  5. Metformin 500 mg p.o. daily.  6. The patient's home medications.   CONSULTATIONS:  Cardiology was consulted for the tachy-brady syndrome,  digoxin toxicity and atrial fibrillation, which was difficult to  control.   PROCEDURES:  CT-head:  No acute intracranial abnormality.  Chest x-ray  on November 07, 2006:  Single-lead pacemaker now present, and COPD.   BRIEF HISTORY:  This is a 77 year old female with history of atrial  fibrillation, history of chronic diarrhea, admitted for generalized body  weakness, found to have tachy-brady syndrome with atrial fibrillation  and digoxin toxicity.  1. For the digoxin toxicity, digoxin was kept on hold.  Cardiology was      consulted, and agreed with holding digoxin and continuing with      Lopressor.  During hospitalization, heart rate was difficult to      control.  Decision was to make a pacemaker.  On November 06, 2006,  the      patient underwent single-chamber pacemaker by Dr. Myrtis Ser, with no      complications, the BiV  VVIpacemaker.  Lovenox for DVT prophylaxis      was kept on hold.  Cardiology recommended starting Coumadin.      Coumadin was started.  INR is not therapeutic.  The patient would      be discharged on Coumadin 5 mg p.o. daily and follow with the      [facility] on November 13, 2006, at 2:30 p.m. For the pacemaker      also arrangements were done to be seen by [facility] to be seen on      November 26, 2006, at 9:00 a.m.  She will follow with Dr. Graciela Husbands as an      outpatient.  2. Orthostatic hypotension with some orthostasis.  Patient was advised      to keep her legs elevated.  Adjustment of her blood pressure  medication was done with holding hydrochlorothiazide and      lisinopril.  The patient was discharged on a beta-blocker.  3. Chronic diarrhea, remaining stable with Imodium.  4. Hyponatremia, status post correction.  On the date of discharge      sodium was 133.  On the date of discharge, the patient has no      complaints.  Vital signs:  Temperature 97.8, pulse rate of 82,      blood pressure 175/96.  Blood workup:  Sodium 133, potassium 4.3,      chloride 99, CO2 29, BUN 17 and creatinine 0.93.  White blood cells      8.9, hemoglobin 13.3, hematocrit 39.3 and platelets 189.  We felt      that the patient is medically stable to be discharged home.  Follow      with Coumadin clinic and follow with Dr. Graciela Husbands as an outpatient for      recommendations involving the pacemaker.      [name], MD  Electronically Signed     HIE/MEDQ  D:  11/08/2006  T:  11/08/2006  Job:  [id number]

## 2010-09-13 NOTE — Consult Note (Signed)
NAMEPHUONG, HILLARY NO.:  [id number]   MEDICAL RECORD NO.:  [id number]          PATIENT TYPE:  INP   LOCATION:  1844                         FACILITY:  [facility]   PHYSICIAN:  Luis Abed, MD, FACCDATE OF BIRTH:  11/27/1919   DATE OF CONSULTATION:  10/31/2006  DATE OF DISCHARGE:                                 CONSULTATION   PRIMARY CARE PHYSICIAN:  Dr. Zenaida Deed.   CARDIOLOGIST:  [facility], Dr. Willa Rough.   REASON FOR CONSULTATION:  Bradycardia.   HISTORY OF PRESENT ILLNESS:  This is an 77 year old Caucasian female  with remote history of atrial fibrillation, apparently diagnosed in  1998.  Also with a history of hypertension, anxiety and diabetes type 2,  with end-stage osteoarthritis.  The patient was seen in the emergency  room by the [facility] physician secondary to progressive weakness and  nausea with diarrhea.  The patient has had frequent UTIs in the past,  and has just recently had a regimen of sulfa drugs for recent UTI, which  she has finished taking approximately 1 week ago.  The patient has had  progressive weakness over the last 3-4 months, more so over the last  couple of days, at which time she also noted an irregular heart rhythm.  She states that notices periodically that her heart rate becomes  irregular, and has had this going on for several years.  The patient is  on digoxin at 0.125 mg once a day, and the [facility] physicians  appropriately checked a digoxin level, which was found to be elevated at  3.1.  The patient is being admitted for further evaluation, and we are  asked to consult concerning bradycardia with a ventricular rate of 50  beats per minute in the setting of digoxin toxicity.  The patient denies  any headaches, dizziness or vision changes.  She denies any chest pain  or shortness of breath.  She has noted an irregular heart rate.  She has  been having some dysuria, and complaints of numbness in her feet with  low-grade  nausea and diarrhea over the last week.   REVIEW OF SYSTEMS:  As above; otherwise negative.   PAST MEDICAL HISTORY:  Includes:  1. Intermittent atrial fibrillation since 1988.  2. Hypertension.  3. Anxiety.  4. End-stage osteoarthritis.  5. Diabetes type 2.   PAST SURGICAL HISTORY:  1. Back surgery.  2. Right total hip arthroplasty in 2000.   SOCIAL HISTORY:  Lives in [place].  Her daughters take turns living  with her.  She is retired.  She was widowed approximately a year and a  half ago, and has recently lost about 40 pounds since the death of her  husband.  She does not smoke.  She does not drink.  She is on a diabetic  diet.   FAMILY HISTORY:  Mother died of old age at age 60.  Father was killed  in a tractor accident at age 84.  She has 3 sisters, one which has had  an MI in her 52s, and another sister with heart problems, and other  sister unknown  history.   CURRENT MEDICATIONS AT HOME:  1. Digoxin 0.125 mg once a day.  2. Lopressor 100 mg once a day.  3. Metformin 500 mg once a day.  4. Terazosin 5 mg once a day.  5. Alzapam p.r.n.   ALLERGIES:  1. PENICILLIN.  2. RECENTLY TO SULFA, CAUSING SEVERE NAUSEA AND STOMACH BURNING.  3. RAW APPLES.  4. MSG.   CURRENT LABORATORY DATA:  Sodium 128, potassium 4.5, chloride 97, bicarb  26.9, BUN 24, creatinine 1.0, glucose 135.  Hemoglobin 13.1, hematocrit  38.3, white blood cells 8.1, platelets 190.  EKG revealing slow atrial  fib with a ventricular rate of 51 beats per minute with inferior T wave  flattening and anterior T wave inversion.  Digoxin level 3.1.  Point of  care markers:  CK-MB 1.3, troponin less than 0.05, myoglobin less than  2.9.   PHYSICAL EXAMINATION:  VITAL SIGNS:  Blood pressure 162/86, pulse 50,  respirations 18, weight 124 pounds, temperature 97.  GENERAL:  This is a frail-appearing, Caucasian female in no acute  distress.  HEENT:  Head is normocephalic and atraumatic.  Eyes:  PERRLA.  The   patient wears glasses.  Mucous membranes in the mouth are pink, a little  dry.  Tongue is midline.  NECK:  Supple.  There is no JVD.  No carotid bruits appreciated.  CARDIOVASCULAR:  Irregular rhythm without murmurs, rubs or gallops.  Pulses are 2+ and equal radial and dorsalis pedis.  LUNGS:  Clear to auscultation.  ABDOMEN:  Soft, nontender.  2+ bowel sounds.  No bruits are appreciated.  EXTREMITIES:  Without clubbing, cyanosis or edema.  Several varicosities  are noted along the ankles.  NEURO:  Cranial nerves II-XII are grossly intact.   IMPRESSION:  1. Slow atrial fibrillation with associated nausea and weakness.  2. Digoxin toxicity.  3. History of hypertension.  4. History of diabetes type 2.  5. History of anxiety.   PLAN:  The patient was seen and examined by Dr. Myrtis Ser and myself in the  emergency room.  The patient's symptoms may be related to digoxin  toxicity to include her nausea and weakness.  EKG did show a slow atrial  fibrillation.  We will also suggest that we discontinue the digoxin and  hold the Lopressor.  There is no proven  history of CAD at this time.  We will watch rate and reintroduce meds as  appropriate.  It is recommended that the patient also be evaluated from  GI/GU aspect at your discretion.  In the interim, we agree with  echocardiogram, and we will follow along making further recommendations.      Bettey Mare. Lyman Bishop, NP      Luis Abed, MD, [facility]  Electronically Signed    KML/MEDQ  D:  10/31/2006  T:  10/31/2006  Job:  [id number]

## 2010-09-13 NOTE — Assessment & Plan Note (Signed)
[facility]                            CARDIOLOGY OFFICE NOTE   NAME:Brandi [name], Brandi [name]                     MRN:          [id number]  DATE:12/06/2006                            DOB:          26-May-1919    Brandi [name] is seen post-hospitalization. I had seen her in consultation  at [facility] on 10/31/2006. She has a history of atrial fibrillation. She  has other medical problems. She had syncope and there was question of a  high digoxin level. Ultimately, her digoxin was held and during the  hospitalization, she eventually showed marked variations in her heart  rate and the decision was made to proceed with pacemaker placement. This  was successfully completed and she has done well since then. While in  the hospital, she had a 2D echo showing that her LV function was  vigorous. Her ejection fraction was in the 70% range. Her hemoglobin was  12 to 13. Her creatinine was in the range of 0.9.   She has been seen back for early pacemaker follow up and her unit is  working well. After her pacemaker was placed, she was restarted on beta  blocker. I believe that she can tolerate a higher dose as she was on  this before.   PAST MEDICAL HISTORY:   ALLERGIES:  PENICILLIN and SULFA.   MEDICATIONS:  1. Metoprolol 75 in the morning and 50 in the evening, and this will      be increased to 100 b.i.d.  2. Xanax 0.25 daily.   OTHER MEDICAL PROBLEMS:  See the list below.   REVIEW OF SYSTEMS:  She is actually feeling well. Her review of systems  is negative.   PHYSICAL EXAMINATION:  VITAL SIGNS:  Weight 129 pounds. Systolic blood  pressure is elevated. She has not had a problem with this type of  pressure previously. She is extremely anxious about being here. Follow  up blood pressure check shows that her pressure has come down.  GENERAL:  The patient is here with her daughter today. The patient is  oriented to person, time, and place. Affect is normal.  HEENT:   Reveals no xanthelasma. She has normal extraocular motion.  NECK:  There are no carotid bruits. There is no jugular venous  distension.  LUNGS:  Clear. Respiratory effort is not labored.  CARDIAC:  Reveals an S1 with an S2. The rhythm is irregularly irregular.  The rate is approximately 100 at the time of my evaluation.  ABDOMEN:  Soft. There are no masses or bruits.  EXTREMITIES:  She has no peripheral edema.   EKG reveals atrial fibrillation with a rate ranging from 80 to 100.   PROBLEMS:  1. Status post permanent pacemaker with a Medtronic Alpha Pacemaker      model outlined the chart.  2. Digoxin toxicity when she was first admitted and digoxin has been      stopped.  3. Atrial fibrillation. Her rate is higher than I would like and her      beta blocker dose will be pushed back up.  4. Tachy-Brady syndrome with a  pacemaker placed in July 2008.  5. Hypertension. She has also had some orthostasis during the      hospitalization and I am inclined to push her medicines further at      this time. Anxiety plays a significant role with the hypertensive      aspect of her blood pressure.  6. Anxiety. She is on significant medicines.  7. History of osteoarthritis.  8. History of some chronic diarrhea.  9. Status post hip replacement.  10.Status post recurrent urinary tract infections in the past.  11.Episodes of syncope which appear at this point to have been treated      with her pacemaker.  12.Chest x-ray revealing chronic obstructive pulmonary disease and bi-      apical plural thickening.  13.Vigorous left ventricular function by echo at the time of      catheterization.   The patient is stable. Her Metoprolol dose will be pushed up to 100 mg  b.i.d. I will see her follow up in 4 to 6 weeks.     Luis Abed, MD, [facility]  Electronically Signed    JDK/MedQ  DD: 12/06/2006  DT: 12/07/2006  Job #: [id number]   cc:   Antony Madura, M.D.

## 2010-09-13 NOTE — H&P (Signed)
Brandi [name], Brandi [name]              ACCOUNT NO.:  [id number]   MEDICAL RECORD NO.:  [id number]          PATIENT TYPE:  EMS   LOCATION:  MAJO                         FACILITY:  [facility]   PHYSICIAN:  Lonia Blood, M.D.      DATE OF BIRTH:  November 11, 1919   DATE OF ADMISSION:  10/31/2006  DATE OF DISCHARGE:                              HISTORY & PHYSICAL   PRIMARY CARE PHYSICIAN:  Antony Madura, M.D.   PRESENTING COMPLAINT:  Weakness.   HISTORY OF PRESENT ILLNESS:  The patient is an 77 year old female with  history of irregular heart beat who also had chronic diarrhea with loose  stools most of the time.  The patient recently had urinary tract  infection, which is also recurrent for her.  She was on Septra until  about 2 weeks ago.  She reacted badly to the Septra; according to  patient, she was very sick and almost killed her.  Since then, she has  had worsening abdominal symptoms.  She has felt a little bit nauseated  but not vomiting.  Her diarrhea has continued.  She has been able to  take water, but that I am not sure if she is taking enough.  She started  getting weak gradually over the past week until her daughter decided her  to bring her to the emergency room.  She denied any fever.  She had mild  abdominal discomfort but not abdominal pain.   PAST MEDICAL HISTORY:  Significant for:  1. Degenerative disk disease status post back surgery in 2001.  2. Also, some hip replacement surgery.  3. Arrhythmia, the type is unknown, but the patient has been on      digoxin and Lopressor according to her.  4. She has had recent ear infection with hearing impairment of the      right ear.  5. Hypertension.  6. Chronic diarrhea.  7. Recurring UTIs.   ALLERGIES:  The patient has demonstrated the inability to tolerate  Septra.  She has questionable PENICILLIN allergy.   MEDICATIONS:  The patient only remembers taking digoxin and Lopressor.  She cannot remember the doses, however.   SOCIAL HISTORY:  She lives with her daughter, who is her primary  caregiver.  Her other daughter also visits her.  She is retired.  No  tobacco or alcohol history and no IV drug use.   FAMILY HISTORY:  Noncontributory due to the patient's age.   REVIEW OF SYSTEMS:  A 12-point review of systems performed is negative  except by HPI.   PHYSICAL EXAMINATION:  VITAL SIGNS:  Temperature 97.0, blood pressure  initially 174/89 and currently 88/54, pulse is 57 to 60, respiratory  rate of 18, and sats 98% on room air.  GENERAL:  The patient is a pleasant 77 year old, awake, alert, and  oriented in no acute distress.  HEENT:  PERRL.  EOMI.  NECK:  Supple.  No JVD.  No lymphadenopathy.  RESPIRATORY:  She has good air entry bilaterally.  No wheezes, no rales.  CARDIOVASCULAR SYSTEM:  She has S1, S2.  No audible murmur.  ABDOMEN:  Soft, nontender with positive bowel sounds.  EXTREMITIES:  No edema, cyanosis, or clubbing.  NEURO EXAM:  Essentially showed normal power, 4/4, upper and lower  extremities bilaterally and normal reflexes.   INITIAL LABORATORY:  Showed a sodium of 128, potassium of 4.5, chloride  97, BUN 24, creatinine 1.0 with a bicarb of 27.  White count is 8.1,  hemoglobin 13.1, platelet count 119 with normal differentials.  Chest x-  ray is currently pending.  Urinalysis also pending.   ASSESSMENT:  Therefore, this is an 77 year old female presenting with  generalized weakness.  Her EKG showed an unusual and undetermined rythm  with no visible P waves.  There was an RSR or QR pattern in V1 which may  infer some conduction delay.  The rhythm is irregular, which may  indicate some atrial fibrillation.  No old EKG to compare.   PLAN:  1. Weakness, hypertension, dehydration:  These may have resulted from      patient's GI loss.  With chronic diarrhea, the patient may have      been dehydrated from diarrhea and not be able to keep up with fluid      intake.  At this point, we will  start by fluid resuscitation.  We      will give a bolus of normal saline followed by a rate of 100-125      mL/h until her blood pressure stabilizes.  Admit her to telemetry.      We will check serial cardiac enzymes.  Check her urinalysis to rule      out any infection because of her hypotension.  2. Chronic diarrhea:  I will put her on Lomotil as stated only with      bowel movements.  3. Hyponatremia:  This may reflect the hypotension and the loss of      fluid as indicated.  I will check her TSH and also put her on      normal saline and follow her sodium levels.  4. Arrhythmia:  The patient is on digoxin and Lopressor.  This may      explain her bradycardia.  Her arrhythmia may also be due to      digitalis effect.  We will check digoxin level and also followup      with cardiology.  I hold off the digoxin as well as the Lopressor      at this point.  5. Recurrent urinary tract infection:  Again, we will check her      urinalysis and see if it looks like she is still having symptoms,      as the patient said she had some burning sensation this morning.      If her urinalysis confirms possible urinary tract infection, we      will treat her accordingly.  Otherwise, treatment will depend on      her initial initial response while in the hospital.      Lonia Blood, M.D.  Electronically Signed     LG/MEDQ  D:  10/31/2006  T:  10/31/2006  Job:  [id number]

## 2010-09-13 NOTE — Assessment & Plan Note (Signed)
[name]                            CARDIOLOGY OFFICE NOTE   NAME:Brandi [name], Brandi [name]                     MRN:          [id number]  DATE:03/13/2007                            DOB:          [birth year]    This is an 77 year old, white female patient who is here today for  followup on blood pressure.  She has severe anxiety about coming to the  doctor's office and blood pressures are always elevated.  Dr. Myrtis Ser had  asked her daughter to obtain a blood pressure cuff and keep track of it  and come back to see me for an office visit.  Unfortunately, they just  bought the blood pressure cuff yesterday and has not used it yet.  Blood  pressure is quite elevated here in the office now.   CURRENT MEDICATIONS:  1. Metoprolol 100 mg b.i.d.  2. Xanax 0.25 mg nightly.  3. Amlodipine 2.5 mg two a day.  4. Lasix 20 mg one-half a day.   PHYSICAL EXAMINATION:  GENERAL:  This is an anxious, elderly, 87-year-  old, white female in no acute distress.  VITAL SIGNS:  Initial blood pressure reading by the technician was  169/120.  When I retook it, it was 190/110.  Pulse was 90 and weight  136.  NECK:  Without JVD or thyroid enlargement.  LUNGS:  Clear anterior, posterior and lateral.  HEART:  Regular rate and rhythm at 90 beats per minute.  Normal S1, S2.  There is a 1-2/6 systolic murmur at the left sternal border.  ABDOMEN:  Soft without organomegaly, masses, lesions or abnormal  tenderness.  EXTREMITIES:  +1 edema bilaterally.  Positive distal pulses.   IMPRESSION:  1. Hypertension, uncontrolled.  2. Anxiety with coming to the doctor's office.  3. Atrial fibrillation.  4. Status post permanent pacemaker in July 2008.  5. Digoxin toxicity when in the hospital.  This was stopped.  6. History of osteoarthritis.  7. Chronic diarrhea.  8. Status post hip replacement.  9. Chronic obstructive pulmonary disease with biapical pleural      thickening.  10.Vigorous  left ventricular function by echocardiogram.  11.Mild chronic peripheral edema, some may be due to Norvasc.   PLAN:  I have talked about a 2 g, sodium diet with the patient.  She has  not taken her Norvasc or furosemide yet today.  I have asked her to go  home and take a full 20 mg of Lasix today and take her amlodipine when  she does home.  I have asked that her daughter check her blood pressure  twice a day and keep track of these readings.  She is to call Scherrie Bateman on Friday with a report on these readings.  Dr. Myrtis Ser noted that he  was going to try and make her appointments infrequent because of her  anxiety and try to manage this over the phone.  It did discuss the  importance of getting this under control and risks of stroke.  She and  her daughter both seem to understand.      Jacolyn Reedy, PA-C  Electronically Signed      Arturo Morton. Riley Kill, MD, [facility]  Electronically Signed   ML/MedQ  DD: 03/13/2007  DT: 03/14/2007  Job #: [id number]

## 2010-09-13 NOTE — Assessment & Plan Note (Signed)
Brandi [name]                            CARDIOLOGY OFFICE NOTE   NAME:Brandi [name], Brandi [name]                     MRN:          [id number]  DATE:01/18/2007                            DOB:          [birth year]    Brandi [name] is here for followup.  I saw her last on December 06, 2006.  See the complete problem list.  She has a pacemaker, and she is doing  well.  Her heart rate had been higher than I wanted, and we increased  her metoprolol back to 100 b.i.d.  She returns today, and she is feeling  well, but unfortunately, her blood pressure is elevated, and this will  need further treatment.   PAST MEDICAL HISTORY:   ALLERGIES:  1. PENICILLIN.  2. SULFA.   MEDICATIONS:  1. Metoprolol 100 b.i.d.  2. Xanax daily.   OTHER MEDICAL PROBLEMS:  See the list on the note of December 06, 2006.   REVIEW OF SYSTEMS:  She is feeling well.  She is, of course, concerned  about the blood pressure measurements.  Otherwise, her review of systems  is negative.   PHYSICAL EXAMINATION:  Her weight is 134 pounds.  Blood pressure  initially in our office was measured as high as 195/127.  She was  anxious as she came in.  I repeated her pressure, and it was in the  range of 175/100.  Her pulse was 85.  The patient is oriented to person, time, and place.  Affect is normal.  HEENT:  Reveals no xanthelasma.  She has normal extraocular motion.  There are no carotid bruits.  There is no jugular venous distention.  LUNGS:  Clear.  Respiratory effort is not labored.  CARDIAC EXAM:  Reveals an irregularly irregular rhythm.  There is a soft  systolic murmur.  ABDOMEN:  Soft.  There are no masses or bruits.  She has no significant peripheral edema.   PROBLEMS:  Listed on the note of December 06, 2006.  PROBLEM #3. Atrial fibrillation.  I believe her atrial fibrillation rate  is under better control, but her blood pressure is remaining elevated.  PROBLEM #5. Hypertension.  She had some  orthostasis in the hospital.  Now it is clear that her blood pressure is on the high side, and we will  add Norvasc, and she will be seen back for early blood pressure checks  and a followup with me.   We will see her for followup.  We will have careful followup of this  blood pressure issue.     Luis Abed, MD, [facility]  Electronically Signed    JDK/MedQ  DD: 01/18/2007  DT: 01/18/2007  Job #: [id number]   cc:   Antony Madura, M.D.

## 2010-09-13 NOTE — Assessment & Plan Note (Signed)
[place]                         ELECTROPHYSIOLOGY OFFICE NOTE   NAME:Brandi [name], Brandi [name]                     MRN:          [id number]  DATE:02/26/2007                            DOB:          11/03/1919    Brandi [name] is status post pacemaker implantation for bradycardia. She  is doing okay from that perspective.   She has noted swelling in her feet. She saw Dr. Myrtis Ser a couple of weeks  ago. She has also had significant problems with hypertension and  somebody started her on Norvasc. Her weight is up about 10 or 12 pounds  in the last 3 months.   There is no accompanying dyspnea.   PHYSICAL EXAMINATION:  VITAL SIGNS:  Her blood pressure is 182/101, her  pulse was 78, her weight was 140.  LUNGS:  Clear.  HEART:  Heart sounds were regular.  NECK:  Neck veins were flat.  EXTREMITIES:  1 to 2+ peripheral edema.  SKIN:  Warm and dry.   Interrogation of her Medtronic Adapta pulse generator demonstrates an R  wave of 62 with impedance of 222 with impedance of 669, a threshold of  0.75 at 0.4.   IMPRESSION:  1. Atrial fibrillation - permanent.  2. Bradycardia.  3. Status post pacer for the above  4. Peripheral edema - new and worsening.  5. Hypertension - severe.  6. Norvasc - question contributing to the above.   We will plan to continue her Norvasc for right now. We will start her on  Lasix 10 mg a day to see if we cant get some diuresis as well as some  augmented blood pressure control. We will arrange to have her come back  and see one of the PAs in 2 weeks' time to review a) blood pressure, b)  peripheral edema and c) check a BMET on her Lasix.     Duke Salvia, MD, [facility]  Electronically Signed    SCK/MedQ  DD: 02/26/2007  DT: 02/26/2007  Job #: [id number]

## 2010-09-13 NOTE — Assessment & Plan Note (Signed)
[facility]                            CARDIOLOGY OFFICE NOTE   NAME:Nigh, KAMYA WATLING                     MRN:          [id number]  DATE:02/03/2008                            DOB:          1920/01/23    Ms. Wires is stable.  She has tachy-brady with atrial fibrillation and  a pacemaker.  She is doing well.  She has some swelling that may be  related to Norvasc.  This has been carefully considered and she wants to  continue it.  The patient is 30 and I feel it is very important to not  disrupt her usual routine if possible.  She is here today for the  followup of her atrial fib and blood pressure.  She is not having any  significant palpitations.  She has not had syncope of recent.   PAST MEDICAL HISTORY:  Allergies to PENICILLIN and SULFA.   MEDICATIONS:  1. Metoprolol 100 b.i.d.  2. Xanax as needed.  3. Furosemide 10 mg daily  4. Amlodipine 5 mg p.o. b.i.d.   OTHER MEDICAL PROBLEMS:  See the complete list below.   REVIEW OF SYSTEMS:  She is not having any GI or GU symptoms.  She is not  having any headaches.  She is stable.  Otherwise, her review of systems  is negative.   PHYSICAL EXAMINATION:  VITAL SIGNS:  Blood pressure is 170/80 with a  pulse of 77.  GENERAL:  She becomes quite anxious when she comes in.  I feel it is not  wise to adjust her medicines any further at this time.  The patient is  oriented to person, time, and place and is here with her daughter.  HEENT:  No xanthelasma.  She has normal extraocular motion.  There are  no carotid bruits.  There is no jugular venous distention.  LUNGS:  Clear.  Respiratory effort is not labored.  CARDIAC:  An S1 and S2.  There are no clicks or significant murmurs.  ABDOMEN:  Soft.  She has no significant peripheral edema.   Problems are listed completely on my note of February 12, 2007.  #3.  Atrial fibrillation.  Her rate is controlled.  I have very  carefully reviewed the issue of  Coumadin.  In the past, she has not  wanted Coumadin.  We discussed it again.  She has a significant fall  risk.  She and I and her daughter decided together that it was not  prudent to start Coumadin at this time.  #5.  Hypertension.  She has had some orthostasis in the past.  She feels  quite well.  I believe it is most prudent to continue her current  medication.  #14.  Mild chronic peripheral edema.  This may be related to Norvasc,  but she tolerates this and we will not change her medicines.   She is stable.  I will see her back 6 months.     Luis Abed, MD, [facility]  Electronically Signed    JDK/MedQ  DD: 02/03/2008  DT: 02/04/2008  Job #: [id number]   cc:   Windy Fast  Tilda Franco, M.D.

## 2010-09-13 NOTE — Assessment & Plan Note (Signed)
[facility]                         ELECTROPHYSIOLOGY OFFICE NOTE   NAME:[name] [name] CHATWIN                     MRN:          [id number]  DATE:11/05/2007                            DOB:          1919/11/03    Mrs. Bitton comes in today with two of her daughters.  They have no  concerns.  She had a pacemaker implanted previously for tachybrady  syndrome, permanent atrial fibrillation.  She has also had problems with  peripheral edema and hypertension.  When I saw her last, I had raised  the question as to whether it was related to her Norvasc or we should  try some diuretic.  She was seen by Dr. Dorian Pod a couple of  weeks later with minimal improvement.  The blood pressure was up.  The  Norvasc was increased.   We had a lengthy discussion today, the daughters, the patient, and I  regarding alternatives.  I suspect the Norvasc is the culprit.  They  would like not to change the Norvasc.  Changing to an ACE inhibitor or  some other drug that requires monitoring is very inconvenient for them  in terms of getting to the office to get checked.  They are concerned  about potential side effects of drugs and they would prefer to manage it  at home the way they are managing, which is titrating the Norvasc as  they would like related to the edema and letting the blood pressure  settle where it does.  We discussed the potential risks of systolic  hypertension, specifically strokes.  They are quite comfortable in  leaving the choices the way they are making them.   Based on that, we made no adjustments to the medications.  I did offer  to prescribe her Xanax, which she has been on for some time, for 2  months' time, so that she would have a chance to reestablish with a  primary care physician as she is no longer following up with Dr. Burton Apley.   I should note on examination today her blood pressure is 162/97, her  pulse was 67.  Her lungs were  clear.  Her heart sounds were irregular  and the extremities had 2+ edema.   We will plan to see her again in one year's time as part of device  followup.  She will be followed remotely in the interim.     Duke Salvia, MD, [facility]  Electronically Signed    SCK/MedQ  DD: 11/05/2007  DT: 11/06/2007  Job #: [id number]

## 2010-09-13 NOTE — Assessment & Plan Note (Signed)
[facility]                                 ON-CALL NOTE   GIANNAH, ZAVADIL                     MRN:          [id number]  DATE:11/08/2006                            DOB:          1919-11-29    REFERRING PHYSICIAN:  Duke Salvia, MD, [facility]   I received a call from Gildardo Cranker this evening regarding Ms. Daria  Larock.  Ms. Schaaf was discharged earlier today from [facility],  following pacemaker placement during this admission.  She was given a  prescription for Toprol XL 75 mg q.a.m. and 50 mg q.p.m. that they  brought to the pharmacy, but the pharmacy was unable to read the  handwriting.  Ms. Augello asked if I could call in the prescription.  I  went ahead and called in a prescription for Toprol XL 50 mg one and a  half tablets q.a.m., one tablet q.p.m. #75 with 6 refills to the [facility] at [phone].  Mr. Widmer was grateful for the call-back.     Nicolasa Ducking, ANP  Electronically Signed    CB/MedQ  DD: 11/08/2006  DT: 11/09/2006  Job #: [id number]

## 2010-10-06 ENCOUNTER — Telehealth: Payer: Self-pay | Admitting: Cardiology

## 2010-10-06 NOTE — Telephone Encounter (Signed)
Pt has a spot on her nose and needs to be referred to a dermatolgist

## 2010-10-06 NOTE — Telephone Encounter (Signed)
Spoke with pt's mother, which states patient has a cyst in her nose and has a purulent drainage coming out of it. Mother wants for Dr. Myrtis Ser to referred pt to a dermatologist. She was made aware that Dr. Myrtis Ser was not in, but   pt needs to  To call her  PCP  because she may need antibiotics. Mother states"  If you can't help [place] we will find some one that will" and hung up the phone.

## 2011-01-17 ENCOUNTER — Ambulatory Visit (INDEPENDENT_AMBULATORY_CARE_PROVIDER_SITE_OTHER): Payer: Medicare Other | Admitting: Internal Medicine

## 2011-01-17 DIAGNOSIS — Z950 Presence of cardiac pacemaker: Secondary | ICD-10-CM

## 2011-01-17 DIAGNOSIS — I4891 Unspecified atrial fibrillation: Secondary | ICD-10-CM

## 2011-01-17 LAB — PACEMAKER DEVICE OBSERVATION
BRDY-0002RV: 60 {beats}/min
BRDY-0004RV: 130 {beats}/min
RV LEAD AMPLITUDE: 22.4 mv

## 2011-01-17 NOTE — Patient Instructions (Signed)
Remote monitoring is used to monitor your Pacemaker of ICD from home. This monitoring reduces the number of office visits required to check your device to one time per year. It allows [place] to keep an eye on the functioning of your device to ensure it is working properly. You are scheduled for a device check from home on 04/20/11. You may send your transmission at any time that day. If you have a wireless device, the transmission will be sent automatically. After your physician reviews your transmission, you will receive a postcard with your next transmission date.   Your physician wants you to follow-up in: 1 year with Dr. Graciela Husbands. You will receive a reminder letter in the mail two months in advance. If you don't receive a letter, please call our office to schedule the follow-up appointment.  Your physician recommends that you continue on your current medications as directed. Please refer to the Current Medication list given to you today.

## 2011-01-17 NOTE — Assessment & Plan Note (Addendum)
[facility]                        ELECTROPHYSIOLOGY OFFICE NOTE  NAME:Brandi [name], Brandi [name]                     MRN:          [id number] DATE:01/17/2011                            DOB:          [birth year]   Brandi [name] is seen in followup for tachybrady syndrome and now permanent atrial fibrillation.  She has no new complaints.  She remains as she was previously opposed to oral anticoagulation.  Her current medications include Xanax, amlodipine, furosemide, and metoprolol.  PHYSICAL EXAMINATION:  VITAL SIGNS:  Blood pressure is 168/90, she says this is anomalous. LUNGS:  Clear. HEART:  Sounds were regular. EXTREMITIES:  Wrapped.  2+ edema. ABDOMEN:  Soft. NEUROLOGIC:  She is alert and oriented.  No acute distress.  Interrogation of Medtronic pulse generator demonstrates a R-wave of 15.6 with a pacing impedance of 628, the threshold of 0.75 at 0.4.  Heart rate excursion was adequate.  IMPRESSION: 1. Tachybrady syndrome. 2. Status post pacer for the above. 3. Atrial fibrillation.  As noted, the patient has declined again     after conversation anticoagulation notwithstanding her high risks.     She has some superficial ecchymoses on her arms that play in to     this.  We will plan to continue her current medications.    Duke Salvia, MD, [facility] Electronically Signed   SCK/MedQ  DD: 01/17/2011  DT: 01/17/2011  Job #: [id number]

## 2011-01-24 NOTE — Progress Notes (Signed)
See dictated.

## 2011-02-14 LAB — I-STAT 8, (EC8 V) (CONVERTED LAB)
Bicarbonate: 26.9 — ABNORMAL HIGH
HCT: 41
Hemoglobin: 13.9
Operator id: 146091
Sodium: 128 — ABNORMAL LOW
TCO2: 28
pCO2, Ven: 48.5

## 2011-02-14 LAB — BASIC METABOLIC PANEL
CO2: 26
CO2: 28
CO2: 28
CO2: 30
Calcium: 8.9
Calcium: 9.2
Chloride: 101
Chloride: 103
Chloride: 95 — ABNORMAL LOW
Chloride: 99
Creatinine, Ser: 0.85
Creatinine, Ser: 0.93
GFR calc Af Amer: >60
GFR calc Af Amer: >60
GFR calc Af Amer: >60
GFR calc Af Amer: >60
GFR calc non Af Amer: >60
GFR calc non Af Amer: >60
Glucose, Bld: 135 — ABNORMAL HIGH
Glucose, Bld: 98
Glucose, Bld: 99
Potassium: 3.4 — ABNORMAL LOW
Potassium: 3.4 — ABNORMAL LOW
Potassium: 3.7
Potassium: 4.3
Potassium: 4.3
Sodium: 131 — ABNORMAL LOW
Sodium: 132 — ABNORMAL LOW
Sodium: 133 — ABNORMAL LOW
Sodium: 135
Sodium: 138

## 2011-02-14 LAB — CARDIAC PANEL(CRET KIN+CKTOT+MB+TROPI)
CK, MB: 2.3
Relative Index: INVALID
Relative Index: INVALID
Total CK: 44
Total CK: 44
Troponin I: 0.03
Troponin I: 0.03

## 2011-02-14 LAB — DIFFERENTIAL
Basophils Absolute: 0
Basophils Relative: 0
Basophils Relative: 0
Eosinophils Absolute: 0
Lymphs Abs: 0.7
Neutro Abs: 5.8
Neutrophils Relative %: 72
Neutrophils Relative %: 86 — ABNORMAL HIGH

## 2011-02-14 LAB — URINE CULTURE
Colony Count: NO GROWTH
Culture: NO GROWTH

## 2011-02-14 LAB — CBC
HCT: 34.8 — ABNORMAL LOW
HCT: 37.6
HCT: 39.3
Hemoglobin: 12.8
Hemoglobin: 12.8
Hemoglobin: 13.4
MCHC: 34.1
MCHC: 34.1
MCHC: 34.3
MCV: 89.2
MCV: 91.1
Platelets: 190
RBC: 3.82 — ABNORMAL LOW
RBC: 4.11
RBC: 4.21
RBC: 4.27
RBC: 4.41
RDW: 12.9
RDW: 12.9
WBC: 10.2
WBC: 8.9
WBC: 9.3

## 2011-02-14 LAB — URINE MICROSCOPIC-ADD ON

## 2011-02-14 LAB — PROTIME-INR
INR: 1.2
Prothrombin Time: 15.1
Prothrombin Time: 15.2

## 2011-02-14 LAB — URINALYSIS, ROUTINE W REFLEX MICROSCOPIC
Bilirubin Urine: NEGATIVE
Glucose, UA: NEGATIVE
Hgb urine dipstick: NEGATIVE
Ketones, ur: NEGATIVE
Protein, ur: NEGATIVE
Urobilinogen, UA: 1
pH: 6

## 2011-02-14 LAB — TSH
TSH: 1.64
TSH: 2.33

## 2011-02-14 LAB — B-NATRIURETIC PEPTIDE (CONVERTED LAB): Pro B Natriuretic peptide (BNP): 459 — ABNORMAL HIGH

## 2011-02-14 LAB — COMPREHENSIVE METABOLIC PANEL
ALT: 20
Albumin: 3.5
Alkaline Phosphatase: 51
Alkaline Phosphatase: 53
BUN: 20
CO2: 24
Calcium: 9.1
Chloride: 99
Creatinine, Ser: 0.97
GFR calc non Af Amer: >60
Glucose, Bld: 122 — ABNORMAL HIGH
Glucose, Bld: 149 — ABNORMAL HIGH
Sodium: 127 — ABNORMAL LOW
Total Bilirubin: 0.7
Total Protein: 6.6

## 2011-02-14 LAB — SODIUM, URINE, RANDOM: Sodium, Ur: 35

## 2011-02-14 LAB — POCT CARDIAC MARKERS
CKMB, poc: 1.3
Troponin i, poc: <0.05

## 2011-02-14 LAB — CK TOTAL AND CKMB (NOT AT ARMC)
CK, MB: 2.3
Total CK: 42

## 2011-02-14 LAB — URINALYSIS, DIPSTICK ONLY
Bilirubin Urine: NEGATIVE
Glucose, UA: NEGATIVE
Hgb urine dipstick: NEGATIVE
Specific Gravity, Urine: 1.007
Urobilinogen, UA: 0.2
pH: 6.5

## 2011-02-14 LAB — TROPONIN I: Troponin I: 0.03

## 2011-02-14 LAB — DIGOXIN LEVEL: Digoxin Level: 3.1

## 2011-02-14 LAB — LIPID PANEL
Cholesterol: 206 — ABNORMAL HIGH
HDL: 49

## 2011-04-18 ENCOUNTER — Other Ambulatory Visit: Payer: Self-pay | Admitting: Cardiology

## 2011-04-20 ENCOUNTER — Encounter: Payer: Medicare Other | Admitting: *Deleted

## 2011-04-22 ENCOUNTER — Other Ambulatory Visit: Payer: Self-pay | Admitting: Cardiology

## 2011-04-26 ENCOUNTER — Other Ambulatory Visit: Payer: Self-pay

## 2011-04-26 ENCOUNTER — Other Ambulatory Visit: Payer: Self-pay | Admitting: Cardiology

## 2011-04-26 MED ORDER — FUROSEMIDE 20 MG PO TABS: 10.0000 mg | ORAL_TABLET | Freq: Every day | ORAL | Status: DC

## 2011-04-28 ENCOUNTER — Encounter: Payer: Self-pay | Admitting: *Deleted

## 2011-05-04 ENCOUNTER — Encounter: Payer: Self-pay | Admitting: Internal Medicine

## 2011-05-04 ENCOUNTER — Ambulatory Visit (INDEPENDENT_AMBULATORY_CARE_PROVIDER_SITE_OTHER): Payer: Medicare Other | Admitting: *Deleted

## 2011-05-04 ENCOUNTER — Other Ambulatory Visit: Payer: Self-pay | Admitting: Internal Medicine

## 2011-05-04 DIAGNOSIS — Z950 Presence of cardiac pacemaker: Secondary | ICD-10-CM

## 2011-05-04 DIAGNOSIS — I4891 Unspecified atrial fibrillation: Secondary | ICD-10-CM

## 2011-05-06 LAB — REMOTE PACEMAKER DEVICE
BRDY-0002RV: 60 {beats}/min
BRDY-0004RV: 130 {beats}/min
RV LEAD IMPEDENCE PM: 580 Ohm
RV LEAD THRESHOLD: 0.75 V

## 2011-05-11 ENCOUNTER — Encounter: Payer: Self-pay | Admitting: *Deleted

## 2011-05-11 NOTE — Progress Notes (Signed)
Remote pacer check  

## 2011-08-03 ENCOUNTER — Encounter: Payer: Self-pay | Admitting: Internal Medicine

## 2011-08-03 ENCOUNTER — Ambulatory Visit (INDEPENDENT_AMBULATORY_CARE_PROVIDER_SITE_OTHER): Payer: Medicare Other | Admitting: *Deleted

## 2011-08-03 DIAGNOSIS — I4891 Unspecified atrial fibrillation: Secondary | ICD-10-CM

## 2011-08-03 DIAGNOSIS — I495 Sick sinus syndrome: Secondary | ICD-10-CM

## 2011-08-03 DIAGNOSIS — Z950 Presence of cardiac pacemaker: Secondary | ICD-10-CM

## 2011-08-04 LAB — REMOTE PACEMAKER DEVICE
BATTERY VOLTAGE: 2.77 V
BMOD-0003RV: 30
BRDY-0002RV: 60 {beats}/min

## 2011-08-11 ENCOUNTER — Encounter: Payer: Self-pay | Admitting: *Deleted

## 2011-08-17 NOTE — Progress Notes (Signed)
Remote pacer check  

## 2011-11-03 ENCOUNTER — Ambulatory Visit (INDEPENDENT_AMBULATORY_CARE_PROVIDER_SITE_OTHER): Payer: Medicare Other | Admitting: *Deleted

## 2011-11-03 ENCOUNTER — Encounter: Payer: Self-pay | Admitting: Internal Medicine

## 2011-11-03 DIAGNOSIS — I4891 Unspecified atrial fibrillation: Secondary | ICD-10-CM

## 2011-11-03 DIAGNOSIS — Z950 Presence of cardiac pacemaker: Secondary | ICD-10-CM

## 2011-11-06 ENCOUNTER — Other Ambulatory Visit: Payer: Self-pay | Admitting: Cardiology

## 2011-11-06 LAB — REMOTE PACEMAKER DEVICE
BATTERY VOLTAGE: 2.77 V
BMOD-0005RV: 95 {beats}/min
RV LEAD IMPEDENCE PM: 619 Ohm
RV LEAD THRESHOLD: 0.625 V
VENTRICULAR PACING PM: 88

## 2011-11-06 MED ORDER — AMLODIPINE BESYLATE 5 MG PO TABS: 5.0000 mg | ORAL_TABLET | Freq: Every day | ORAL | Status: DC

## 2011-11-06 MED ORDER — METOPROLOL TARTRATE 100 MG PO TABS: 100.0000 mg | ORAL_TABLET | Freq: Two times a day (BID) | ORAL | Status: DC

## 2011-11-24 ENCOUNTER — Encounter: Payer: Self-pay | Admitting: *Deleted

## 2011-12-05 ENCOUNTER — Inpatient Hospital Stay (HOSPITAL_COMMUNITY)
Admission: EM | Admit: 2011-12-05 | Discharge: 2011-12-07 | DRG: 312 | Disposition: A | Discharge status: Home Health | Payer: Medicare Other | Attending: Internal Medicine | Admitting: Internal Medicine

## 2011-12-05 ENCOUNTER — Encounter (HOSPITAL_COMMUNITY): Payer: Self-pay | Admitting: *Deleted

## 2011-12-05 DIAGNOSIS — R55 Syncope and collapse: Principal | ICD-10-CM

## 2011-12-05 DIAGNOSIS — J4489 Other specified chronic obstructive pulmonary disease: Secondary | ICD-10-CM

## 2011-12-05 DIAGNOSIS — R609 Edema, unspecified: Secondary | ICD-10-CM

## 2011-12-05 DIAGNOSIS — Z880 Allergy status to penicillin: Secondary | ICD-10-CM

## 2011-12-05 DIAGNOSIS — Z66 Do not resuscitate: Secondary | ICD-10-CM | POA: Diagnosis present

## 2011-12-05 DIAGNOSIS — I4891 Unspecified atrial fibrillation: Secondary | ICD-10-CM

## 2011-12-05 DIAGNOSIS — W19XXXA Unspecified fall, initial encounter: Secondary | ICD-10-CM

## 2011-12-05 DIAGNOSIS — W1809XA Striking against other object with subsequent fall, initial encounter: Secondary | ICD-10-CM | POA: Diagnosis present

## 2011-12-05 DIAGNOSIS — S022XXA Fracture of nasal bones, initial encounter for closed fracture: Secondary | ICD-10-CM

## 2011-12-05 DIAGNOSIS — Y92009 Unspecified place in unspecified non-institutional (private) residence as the place of occurrence of the external cause: Secondary | ICD-10-CM

## 2011-12-05 DIAGNOSIS — M159 Polyosteoarthritis, unspecified: Secondary | ICD-10-CM

## 2011-12-05 DIAGNOSIS — Z79899 Other long term (current) drug therapy: Secondary | ICD-10-CM

## 2011-12-05 DIAGNOSIS — Z96649 Presence of unspecified artificial hip joint: Secondary | ICD-10-CM

## 2011-12-05 DIAGNOSIS — Z950 Presence of cardiac pacemaker: Secondary | ICD-10-CM

## 2011-12-05 DIAGNOSIS — Z882 Allergy status to sulfonamides status: Secondary | ICD-10-CM

## 2011-12-05 DIAGNOSIS — F411 Generalized anxiety disorder: Secondary | ICD-10-CM | POA: Diagnosis present

## 2011-12-05 DIAGNOSIS — E871 Hypo-osmolality and hyponatremia: Secondary | ICD-10-CM

## 2011-12-05 DIAGNOSIS — I495 Sick sinus syndrome: Secondary | ICD-10-CM | POA: Diagnosis present

## 2011-12-05 DIAGNOSIS — I10 Essential (primary) hypertension: Secondary | ICD-10-CM

## 2011-12-05 DIAGNOSIS — J449 Chronic obstructive pulmonary disease, unspecified: Secondary | ICD-10-CM | POA: Diagnosis present

## 2011-12-05 NOTE — ED Notes (Signed)
Per PTAR: pt from home, lives with daughters.  Oriented to person and event.  Family states she is oriented per her norm.  Pt got out of bed to walk to dresser and fell on face.  C/o face pain.  Denies back, neck pain.

## 2011-12-05 NOTE — ED Notes (Signed)
Pt had minor nosebleed that stopped before EMS arrival.  Also c/o brief period of dizziness.

## 2011-12-06 ENCOUNTER — Emergency Department (HOSPITAL_COMMUNITY): Payer: Medicare Other

## 2011-12-06 ENCOUNTER — Encounter (HOSPITAL_COMMUNITY): Payer: Self-pay | Admitting: Radiology

## 2011-12-06 DIAGNOSIS — E871 Hypo-osmolality and hyponatremia: Secondary | ICD-10-CM | POA: Diagnosis present

## 2011-12-06 DIAGNOSIS — R55 Syncope and collapse: Secondary | ICD-10-CM

## 2011-12-06 DIAGNOSIS — W19XXXA Unspecified fall, initial encounter: Secondary | ICD-10-CM

## 2011-12-06 DIAGNOSIS — Y92009 Unspecified place in unspecified non-institutional (private) residence as the place of occurrence of the external cause: Secondary | ICD-10-CM

## 2011-12-06 DIAGNOSIS — Z950 Presence of cardiac pacemaker: Secondary | ICD-10-CM

## 2011-12-06 LAB — URINALYSIS, ROUTINE W REFLEX MICROSCOPIC
Bilirubin Urine: NEGATIVE
Glucose, UA: NEGATIVE mg/dL
Nitrite: NEGATIVE
Specific Gravity, Urine: 1.007 (ref 1.005–1.030)
pH: 7 (ref 5.0–8.0)

## 2011-12-06 LAB — BASIC METABOLIC PANEL
BUN: 17 mg/dL (ref 6–23)
CO2: 25 mEq/L (ref 19–32)
Calcium: 9.7 mg/dL (ref 8.4–10.5)
GFR calc non Af Amer: 52 mL/min — ABNORMAL LOW (ref >90)
Glucose, Bld: 131 mg/dL — ABNORMAL HIGH (ref 70–99)
Sodium: 129 mEq/L — ABNORMAL LOW (ref 135–145)

## 2011-12-06 LAB — CBC
HCT: 38.3 % (ref 36.0–46.0)
Hemoglobin: 13.3 g/dL (ref 12.0–15.0)
MCH: 32.6 pg (ref 26.0–34.0)
MCHC: 34.7 g/dL (ref 30.0–36.0)
MCV: 93.9 fL (ref 78.0–100.0)
RBC: 4.08 MIL/uL (ref 3.87–5.11)

## 2011-12-06 LAB — POCT I-STAT TROPONIN I

## 2011-12-06 LAB — URINE MICROSCOPIC-ADD ON

## 2011-12-06 MED ORDER — SODIUM CHLORIDE 0.9 % IV SOLN
INTRAVENOUS | Status: DC
  Administered 2011-12-06: 05:00:00 via INTRAVENOUS

## 2011-12-06 MED ORDER — ACETAMINOPHEN 325 MG PO TABS
650.0000 mg | ORAL_TABLET | Freq: Four times a day (QID) | ORAL | Status: DC | PRN
  Administered 2011-12-06: 650 mg via ORAL
  Filled 2011-12-06: qty 2

## 2011-12-06 MED ORDER — ONDANSETRON HCL 4 MG/2ML IJ SOLN: 4.0000 mg | Freq: Once | INTRAMUSCULAR | Status: DC

## 2011-12-06 MED ORDER — ENOXAPARIN SODIUM 40 MG/0.4ML ~~LOC~~ SOLN
40.0000 mg | SUBCUTANEOUS | Status: DC
  Filled 2011-12-06 (×2): qty 0.4

## 2011-12-06 MED ORDER — SODIUM CHLORIDE 0.9 % IJ SOLN
3.0000 mL | Freq: Two times a day (BID) | INTRAMUSCULAR | Status: DC
  Administered 2011-12-06 – 2011-12-07 (×2): 3 mL via INTRAVENOUS

## 2011-12-06 MED ORDER — SODIUM CHLORIDE 0.9 % IV SOLN
INTRAVENOUS | Status: DC
  Administered 2011-12-06: 10:00:00 via INTRAVENOUS

## 2011-12-06 MED ORDER — METOPROLOL TARTRATE 100 MG PO TABS
100.0000 mg | ORAL_TABLET | Freq: Two times a day (BID) | ORAL | Status: DC
  Administered 2011-12-06 – 2011-12-07 (×3): 100 mg via ORAL
  Filled 2011-12-06 (×4): qty 1

## 2011-12-06 MED ORDER — SODIUM CHLORIDE 0.9 % IV SOLN: INTRAVENOUS | Status: DC

## 2011-12-06 NOTE — ED Notes (Signed)
Pt denies pain and nausea.  

## 2011-12-06 NOTE — ED Provider Notes (Signed)
History     CSN: [id number]  Arrival date & time 12/05/11  2340   First MD Initiated Contact with Patient 12/06/11 0009      Chief Complaint  Patient presents with  . Fall    (Consider location/radiation/quality/duration/timing/severity/associated sxs/prior treatment) HPI History provided by patient and family bedside. Lives at home with 2 daughters. Was getting out of her bed tonight and believes she had a syncopal event. She is unable to recall why she got out of bed. At first believes she is going to bathroom and later states she is going to brush her hair.  No chest pain or shortness of breath. Questionable LOC. Found down by daughters awake and alert. No seizure activity. Noted to have nasal swelling and bruising. No upper extremity injury. Patient denies any other pain injury or trauma. No neck pain or unilateral weakness. Has been having some generalized weakness over the last 2-3 days. Past Medical History  Diagnosis Date  . Atrial fibrillation   . Tachy-brady syndrome     PPM  . Syncope     PPM  . Digoxin toxicity   . Osteoarthritis   . Anxiety   . HTN (hypertension)   . Orthostasis   . COPD (chronic obstructive pulmonary disease)   . Tricuspid regurgitation   . Edema     Past Surgical History  Procedure Date  . Total hip arthroplasty   . Back surgery 2001  . Pacemaker insertion 2008    History reviewed. No pertinent family history.  History  Substance Use Topics  . Smoking status: Never Smoker   . Smokeless tobacco: Not on file  . Alcohol Use: No    OB History    Grav Para Term Preterm Abortions TAB SAB Ect Mult Living                  Review of Systems  Constitutional: Negative for fever and chills.  HENT: Negative for neck pain and neck stiffness.   Eyes: Negative for pain.  Respiratory: Negative for shortness of breath.   Cardiovascular: Negative for chest pain.  Gastrointestinal: Negative for abdominal pain.  Genitourinary: Negative for  dysuria.  Musculoskeletal: Negative for back pain.  Skin: Positive for wound. Negative for rash.  Neurological: Negative for headaches.  All other systems reviewed and are negative.    Allergies  Codeine phosphate; Penicillins; and Sulfonamide derivatives  Home Medications   Current Outpatient Rx  Name Route Sig Dispense Refill  . ALPRAZOLAM 0.25 MG PO TABS Oral Take 0.25 mg by mouth at bedtime as needed. For anxiety    . AMLODIPINE BESYLATE 5 MG PO TABS Oral Take 1 tablet (5 mg total) by mouth daily. 90 tablet 1  . FUROSEMIDE 20 MG PO TABS  TAKE  1/2 TABLET BY MOUTH EVERY DAY 45 tablet 3    **Patient requests 90 day supply**  . METOPROLOL TARTRATE 100 MG PO TABS Oral Take 1 tablet (100 mg total) by mouth 2 (two) times daily. 180 tablet 1    BP 168/86  Pulse 73  Temp 97.7 F (36.5 C) (Oral)  Resp 22  SpO2 96%  Physical Exam  Constitutional: She appears well-developed and well-nourished.  HENT:  Head: Normocephalic.       Swelling and ecchymosis over bridge of nose. No epistaxis or septal hematoma. No laceration. Mild abrasion. No midface instability. No trismus.  Eyes: Conjunctivae and EOM are normal. Pupils are equal, round, and reactive to light.  Neck: Trachea normal. Neck supple.  No midline cervical tenderness or deformity.  Cardiovascular: Normal rate, regular rhythm, S1 normal, S2 normal and normal pulses.     No systolic murmur is present   No diastolic murmur is present  Pulses:      Radial pulses are 2+ on the right side, and 2+ on the left side.  Pulmonary/Chest: Effort normal and breath sounds normal. She has no wheezes. She has no rhonchi. She has no rales. She exhibits no tenderness.  Abdominal: Soft. Normal appearance and bowel sounds are normal. There is no tenderness. There is no CVA tenderness and negative Murphy's sign.  Musculoskeletal:       BLE:s Calves nontender, no cords or erythema, negative Homans sign  Neurological: She is alert. She has  normal strength. No cranial nerve deficit or sensory deficit. GCS eye subscore is 4. GCS verbal subscore is 5. GCS motor subscore is 6.  Skin: Skin is warm and dry. No rash noted. She is not diaphoretic.  Psychiatric: Her speech is normal.       Cooperative and appropriate    ED Course  Procedures (including critical care time)  Labs Reviewed  BASIC METABOLIC PANEL - Abnormal; Notable for the following:    Sodium 129 (*)     Chloride 92 (*)     Glucose, Bld 131 (*)     GFR calc non Af Amer 52 (*)     GFR calc Af Amer 60 (*)     All other components within normal limits  CBC  POCT I-STAT TROPONIN I  URINALYSIS, ROUTINE W REFLEX MICROSCOPIC   Dg Chest 2 View  12/06/2011  *RADIOLOGY REPORT*  Clinical Data: Fall.  Nausea.  CHEST - 2 VIEW  Comparison: 11/07/2006  Findings: Stable appearance of cardiac pacemakers since previous study.  Borderline heart size with normal pulmonary vascularity. Emphysematous changes and scattered fibrosis in the lungs.  No focal airspace consolidation.  No blunting of costophrenic angles. No pneumothorax.  Mediastinal contours appear intact.  Degenerative changes in the thoracic spine with endplate compression deformities consistent with osteoporosis.  No significant change since previous study.  IMPRESSION: Emphysema and chronic bronchitic changes in the lungs.  No evidence of active pulmonary disease.  Original Report Authenticated By: Marlon Pel, M.D.   Dg Forearm Right  12/06/2011  *RADIOLOGY REPORT*  Clinical Data: Laceration over the wrist after fall.  RIGHT FOREARM - 2 VIEW  Comparison: None.  Findings: Right radius and ulna appear intact. No evidence of acute fracture or subluxation.  No focal bone lesions.  Bone matrix and cortex appear intact.  No abnormal radiopaque densities in the soft tissues.  Degenerative changes in the carpal joints.  Benign- appearing cyst in the lunate.  IMPRESSION: No acute bony abnormalities.  No radiopaque foreign bodies.  Degenerative changes in the wrist.  Original Report Authenticated By: Marlon Pel, M.D.   Ct Head Wo Contrast  12/06/2011  *RADIOLOGY REPORT*  Clinical Data:  Fall.  CT HEAD WITHOUT CONTRAST CT MAXILLOFACIAL WITHOUT CONTRAST CT CERVICAL SPINE WITHOUT CONTRAST  Technique:  Multidetector CT imaging of the head, cervical spine, and maxillofacial structures were performed using the standard protocol without intravenous contrast. Multiplanar CT image reconstructions of the cervical spine and maxillofacial structures were also generated.  Comparison:  CT head 11/10/2006  CT HEAD  Findings: Diffuse cerebral atrophy.  Low attenuation change in the deep white matter consistent with small vessel ischemia. Ventricular dilatation consistent with central atrophy.  No mass effect or midline shift.  No abnormal extra-axial fluid collections.  Gray-white matter junctions are distinct.  Basal cisterns are not effaced.  No evidence of acute intracranial hemorrhage.  No significant change since previous study.  No depressed skull fractures.  Visualized paranasal sinuses and mastoid air cells are not opacified.  Vascular calcifications.  IMPRESSION: No acute intracranial abnormalities.  Chronic atrophy and small vessel ischemic change.  CT MAXILLOFACIAL  Findings:  Subcutaneous hematoma along the midline frontal region and extending down to the bridge of the nose.  No underlying skull fracture.  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  No acute air-fluid levels in the paranasal sinuses.  The globes and extraocular muscles appear intact and symmetrical.  Minimally depressed nasal bone fracture.  The nasal septum, nasal spine, orbital rims, maxillary antral walls, pterygoid plates, zygomatic arches, temporomandibular joints, and mandibles appear intact.  No displaced fractures identified.  IMPRESSION: Thickened bone and  trabecular prominence in the left maxillary antral region consistent with chronic process such as fibrous dysplasia.  Midline frontal soft tissue hematoma.  Mildly displaced nasal bone fracture.  No orbital or facial fractures.  CT CERVICAL SPINE  Findings:   Prominent degenerative changes throughout the cervical spine with narrowed cervical interspaces and endplate hypertrophic changes.  Degenerative changes throughout the facet joints. Degenerative changes at C1-2.  Mild anterior subluxation at C4-5 and C7-T1 consistent with degenerative change.  No significant vertebral compression deformities.  No prevertebral soft tissue swelling.  Lateral masses of C1 appear symmetrical.  The odontoid process appears intact.  No paraspinal mass.  Fibrosis in the lung apices.  IMPRESSION: Marked degenerative change throughout the cervical spine.  No displaced fractures identified.  Original Report Authenticated By: Marlon Pel, M.D.   Ct Cervical Spine Wo Contrast  12/06/2011  *RADIOLOGY REPORT*  Clinical Data:  Fall.  CT HEAD WITHOUT CONTRAST CT MAXILLOFACIAL WITHOUT CONTRAST CT CERVICAL SPINE WITHOUT CONTRAST  Technique:  Multidetector CT imaging of the head, cervical spine, and maxillofacial structures were performed using the standard protocol without intravenous contrast. Multiplanar CT image reconstructions of the cervical spine and maxillofacial structures were also generated.  Comparison:  CT head 11/10/2006  CT HEAD  Findings: Diffuse cerebral atrophy.  Low attenuation change in the deep white matter consistent with small vessel ischemia. Ventricular dilatation consistent with central atrophy.  No mass effect or midline shift.  No abnormal extra-axial fluid collections.  Gray-white matter junctions are distinct.  Basal cisterns are not effaced.  No evidence of acute intracranial hemorrhage.  No significant change since previous study.  No depressed skull fractures.  Visualized paranasal sinuses and mastoid air  cells are not opacified.  Vascular calcifications.  IMPRESSION: No acute intracranial abnormalities.  Chronic atrophy and small vessel ischemic change.  CT MAXILLOFACIAL  Findings:  Subcutaneous hematoma along the midline frontal region and extending down to the bridge of the nose.  No underlying skull fracture.  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  No acute air-fluid levels in the paranasal sinuses.  The globes and extraocular muscles appear intact and symmetrical.  Minimally depressed nasal bone fracture.  The nasal septum, nasal spine, orbital rims, maxillary antral walls, pterygoid plates, zygomatic arches, temporomandibular joints, and mandibles appear intact.  No displaced fractures identified.  IMPRESSION: Thickened bone and trabecular prominence in the left maxillary antral region consistent with chronic process such as fibrous dysplasia.  Midline frontal soft tissue hematoma.  Mildly displaced nasal bone fracture.  No orbital or facial fractures.  CT CERVICAL SPINE  Findings:   Prominent degenerative changes throughout the cervical spine with narrowed cervical interspaces and endplate hypertrophic changes.  Degenerative changes throughout the facet joints. Degenerative changes at C1-2.  Mild anterior subluxation at C4-5 and C7-T1 consistent with degenerative change.  No significant vertebral compression deformities.  No prevertebral soft tissue swelling.  Lateral masses of C1 appear symmetrical.  The odontoid process appears intact.  No paraspinal mass.  Fibrosis in the lung apices.  IMPRESSION: Marked degenerative change throughout the cervical spine.  No displaced fractures identified.  Original Report Authenticated By: Marlon Pel, M.D.   Ct Maxillofacial Wo Cm  12/06/2011  *RADIOLOGY REPORT*  Clinical Data:  Fall.  CT HEAD WITHOUT CONTRAST CT MAXILLOFACIAL WITHOUT CONTRAST CT CERVICAL  SPINE WITHOUT CONTRAST  Technique:  Multidetector CT imaging of the head, cervical spine, and maxillofacial structures were performed using the standard protocol without intravenous contrast. Multiplanar CT image reconstructions of the cervical spine and maxillofacial structures were also generated.  Comparison:  CT head 11/10/2006  CT HEAD  Findings: Diffuse cerebral atrophy.  Low attenuation change in the deep white matter consistent with small vessel ischemia. Ventricular dilatation consistent with central atrophy.  No mass effect or midline shift.  No abnormal extra-axial fluid collections.  Gray-white matter junctions are distinct.  Basal cisterns are not effaced.  No evidence of acute intracranial hemorrhage.  No significant change since previous study.  No depressed skull fractures.  Visualized paranasal sinuses and mastoid air cells are not opacified.  Vascular calcifications.  IMPRESSION: No acute intracranial abnormalities.  Chronic atrophy and small vessel ischemic change.  CT MAXILLOFACIAL  Findings:  Subcutaneous hematoma along the midline frontal region and extending down to the bridge of the nose.  No underlying skull fracture.  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  No acute air-fluid levels in the paranasal sinuses.  The globes and extraocular muscles appear intact and symmetrical.  Minimally depressed nasal bone fracture.  The nasal septum, nasal spine, orbital rims, maxillary antral walls, pterygoid plates, zygomatic arches, temporomandibular joints, and mandibles appear intact.  No displaced fractures identified.  IMPRESSION: Thickened bone and trabecular prominence in the left maxillary antral region consistent with chronic process such as fibrous dysplasia.  Midline frontal soft tissue hematoma.  Mildly displaced nasal bone fracture.  No orbital or facial fractures.  CT CERVICAL SPINE   Findings:   Prominent degenerative changes throughout the cervical spine with narrowed cervical interspaces and endplate hypertrophic changes.  Degenerative changes throughout the facet joints. Degenerative changes at C1-2.  Mild anterior subluxation at C4-5 and C7-T1 consistent with degenerative change.  No significant vertebral compression deformities.  No prevertebral soft tissue swelling.  Lateral masses of C1 appear symmetrical.  The odontoid process appears intact.  No paraspinal mass.  Fibrosis in the lung apices.  IMPRESSION: Marked degenerative change throughout the cervical spine.  No displaced fractures identified.  Original Report Authenticated By: Marlon Pel, M.D.      Date: 12/06/2011  Rate: 67  Rhythm: paced  QRS Axis: normal  Intervals: normal  ST/T Wave abnormalities: nonspecific ST changes  Conduction Disutrbances:none  Narrative Interpretation:   Old EKG Reviewed: unchanged  3:47 AM on recheck condition unchanged, declines pain medications. EKG imaging and labs reviewed as above. IV fluids provided and medicine consult for  admission.  case discussed with Dr. Houston Siren who agrees to admission   MDM   Syncope with nasal fractures and mild hyponatremia. Plan medical admit.        Sunnie Nielsen, MD 12/08/11 0800

## 2011-12-06 NOTE — Clinical Social Work Psychosocial (Signed)
     Clinical Social Work Department BRIEF PSYCHOSOCIAL ASSESSMENT 12/06/2011  Patient:  Brandi [name], Brandi [name]     Account Number:  [id number]     Admit date:  12/05/2011  Clinical Social Worker:  Doree Albee  Date/Time:  12/06/2011 02:32 PM  Referred by:  RN  Date Referred:  12/06/2011 Referred for  SNF Placement   Other Referral:   Interview type:  Patient Other interview type:   and patient daughter    PSYCHOSOCIAL DATA Living Status:  FAMILY Admitted from facility:   Level of care:   Primary support name:  Brandi [name] Primary support relationship to patient:  CHILD, ADULT Degree of support available:   strong    CURRENT CONCERNS Current Concerns  Post-Acute Placement   Other Concerns:    SOCIAL WORK ASSESSMENT / PLAN CSW met with pt at bedside to discuss pt current living environment and pt discharge plans. CSW introduced self and role, and asked pt permission to discuss with pt duaghter. Pt gave csw permission to discuss with pt daughter.    Pt and pt daughter shared that patient lives at home with pt two daughters who provide 24 hour care for patient at home. CSW, pt and pt daughter discussed recommendations from physical therapy of short term rehab at a skilled nursing facility.    Pt and pt daughter declined skilled nursing placement for rehab. Pt daughter and pt opted for home health services for physical therapy. CSW and Pt daughter discussed home health agencies. CSW provided patient daughter with list of agencies. pt daughter stated that she would like to go with [facility] as she believe pt has used this agency before in 2008.    CSW informed RN case manager of pt needs at home.  CSW, pt, and pt daughter discussed dme that may assist pt at home including a bed side comode to assist with pt toileting.   Assessment/plan status:  No Further Intervention Required Other assessment/ plan:   Information/referral to community resources:   Home  health agencies    PATIENTS/FAMILYS RESPONSE TO PLAN OF CARE: Pt and pt dtr thanked csw for concern and support. Pt is motivated to return home and recieve home health pt in home for a few days a week to assist with pt mobility.

## 2011-12-06 NOTE — Progress Notes (Signed)
Utilization review completed.  

## 2011-12-06 NOTE — ED Notes (Signed)
Pt is back in her room.

## 2011-12-06 NOTE — Progress Notes (Signed)
Patient seen and examined by me- please see H&P by Dr. Conley Rolls.  Family has 24 hour care at home and would prefer to take her home with home health.  Appears to be a mechanical fall.  ?D/C tomm. Moves all 4 extremities.     Marlin Canary DO

## 2011-12-06 NOTE — Progress Notes (Signed)
CSW received verbal referral for potential disposition needs. CSW awaiting pt/ot evaluation to help determine pt needs. .Clinical social worker continuing to follow pt to assist with pt dc plans and further csw needs.   Catha Gosselin, Theresia Majors  [phone] .12/06/2011 1158am

## 2011-12-06 NOTE — H&P (Signed)
[facility] History and Physical  VERLEEN STUCKEY ION:[id number] DOB: Oct 05, 1919    PCP:   Lorenda Peck, MD   Chief Complaint: A bad fall HPI: Brandi [name] is an 78 y.o. female with history of tachybradycardia syndrome status post permanent pacemaker placement, hypertension, anxiety, brought in by her 2 daughters because she fell, lost consciousness, and fractured her nose. It was unclear as to whether or not she tripped and fell, and because of the impact loss consciousness, or had a syncopal episode. Evaluation in emergency room included a head CT, which showed the nasal fracture, but no intracranial hemorrhage, neck  CT was unremarkable. Her EKG showed ventricular paced rhythm. Her daughters were quite unsure about whether or not to take her home AMA, or have her admitted. After consideration, they agreed to have admitted him for observation. She was also found to have mild hyponatremia with serum sodium of 129, and has been on diuretic.   Rewiew of Systems:  Constitutional: Negative for malaise, fever and chills. No significant weight loss or weight gain Eyes: Negative for eye pain, redness and discharge, diplopia, visual changes, or flashes of light. ENMT: Negative for ear pain, hoarseness, nasal congestion, sinus pressure and sore throat. No headaches; tinnitus, drooling, or problem swallowing. Cardiovascular: Negative for chest pain, palpitations, diaphoresis, dyspnea and peripheral edema. ; No orthopnea, PND Respiratory: Negative for cough, hemoptysis, wheezing and stridor. No pleuritic chestpain. Gastrointestinal: Negative for nausea, vomiting, diarrhea, constipation, abdominal pain, melena, blood in stool, hematemesis, jaundice and rectal bleeding.    Genitourinary: Negative for frequency, dysuria, incontinence,flank pain and hematuria; Musculoskeletal: Negative for back pain and neck pain. Negative for swelling and trauma.;  Skin: . Negative for pruritus, rash,  abrasions, bruising and skin lesion.; ulcerations Neuro: Negative for headache, lightheadedness and neck stiffness. Negative for weakness, altered level of consciousness , altered mental status, extremity weakness, burning feet, involuntary movement, seizure and syncope.  Psych: negative for anxiety, depression, insomnia, tearfulness, panic attacks, hallucinations, paranoia, suicidal or homicidal ideation    Past Medical History  Diagnosis Date  . Atrial fibrillation   . Tachy-brady syndrome     PPM  . Syncope     PPM  . Digoxin toxicity   . Osteoarthritis   . Anxiety   . HTN (hypertension)   . Orthostasis   . COPD (chronic obstructive pulmonary disease)   . Tricuspid regurgitation   . Edema     Past Surgical History  Procedure Date  . Total hip arthroplasty   . Back surgery 2001  . Pacemaker insertion 2008    Medications:  HOME MEDS: Prior to Admission medications   Medication Sig Start Date End Date Taking? Authorizing Provider  ALPRAZolam (XANAX) 0.25 MG tablet Take 0.25 mg by mouth at bedtime as needed. For anxiety 01/17/11  Yes Duke Salvia, MD  amLODipine (NORVASC) 5 MG tablet Take 1 tablet (5 mg total) by mouth daily. 11/06/11  Yes Luis Abed, MD  furosemide (LASIX) 20 MG tablet TAKE  1/2 TABLET BY MOUTH EVERY DAY 04/26/11  Yes Luis Abed, MD  metoprolol (LOPRESSOR) 100 MG tablet Take 1 tablet (100 mg total) by mouth 2 (two) times daily. 11/06/11  Yes Luis Abed, MD     Allergies:  Allergies  Allergen Reactions  . Codeine Phosphate     REACTION: unspecified  . Penicillins     REACTION: unspecified  . Sulfonamide Derivatives     Social History:   reports that she has never  smoked. She does not have any smokeless tobacco history on file. She reports that she does not drink alcohol or use illicit drugs.  Family History: History reviewed. No pertinent family history.   Physical Exam: Filed Vitals:   12/05/11 2344 12/06/11 0003 12/06/11 0459    BP: 172/84 168/86   Pulse: 67 73   Temp: 97.9 F (36.6 C) 97.7 F (36.5 C) 97.7 F (36.5 C)  TempSrc: Oral Oral   Resp: 22    SpO2: 100% 96%    Blood pressure 168/86, pulse 73, temperature 97.7 F (36.5 C), temperature source Oral, resp. rate 22, SpO2 96.00%.  GEN:  Pleasant  patient lying in the stretcher in no acute distress; cooperative with exam. PSYCH: does not appear anxious or depressed; affect is appropriate. HEENT: Mucous membranes pink and anicteric; PERRLA; EOM intact; no cervical lymphadenopathy nor thyromegaly or carotid bruit; no JVD; There were no stridor. Neck is very supple. Breasts:: Not examined CHEST WALL: No tenderness CHEST: Normal respiration, clear to auscultation bilaterally.  HEART: Regular rate and rhythm.  There are no murmur, rub, or gallops.   BACK: No kyphosis or scoliosis; no CVA tenderness ABDOMEN: soft and non-tender; no masses, no organomegaly, normal abdominal bowel sounds; no pannus; no intertriginous candida. There is no rebound and no distention. Rectal Exam: Not done EXTREMITIES: No bone or joint deformity; age-appropriate arthropathy of the hands and knees;2+ edema bilaterally; no ulcerations.  There is no calf tenderness. Genitalia: not examined PULSES: 2+ and symmetric SKIN: Normal hydration no rash or ulceration CNS: Cranial nerves 2-12 grossly intact no focal lateralizing neurologic deficit.  Speech is fluent; uvula elevated with phonation, facial symmetry and tongue midline. DTR are normal bilaterally, cerebella exam is intact, barbinski is negative and strengths are equaled bilaterally.  No sensory loss.   Labs on Admission:  Basic Metabolic Panel:  Lab 12/05/11 1610  NA 129*  K 4.2  CL 92*  CO2 25  GLUCOSE 131*  BUN 17  CREATININE 0.95  CALCIUM 9.7  MG --  PHOS --   Liver Function Tests: No results found for this basename: AST:5,ALT:5,ALKPHOS:5,BILITOT:5,PROT:5,ALBUMIN:5 in the last 168 hours No results found for this  basename: LIPASE:5,AMYLASE:5 in the last 168 hours No results found for this basename: AMMONIA:5 in the last 168 hours CBC:  Lab 12/05/11 2355  WBC 8.0  NEUTROABS --  HGB 13.3  HCT 38.3  MCV 93.9  PLT 211   Cardiac Enzymes: No results found for this basename: CKTOTAL:5,CKMB:5,CKMBINDEX:5,TROPONINI:5 in the last 168 hours  CBG: No results found for this basename: GLUCAP:5 in the last 168 hours   Radiological Exams on Admission: Dg Chest 2 View  12/06/2011  *RADIOLOGY REPORT*  Clinical Data: Fall.  Nausea.  CHEST - 2 VIEW  Comparison: 11/07/2006  Findings: Stable appearance of cardiac pacemakers since previous study.  Borderline heart size with normal pulmonary vascularity. Emphysematous changes and scattered fibrosis in the lungs.  No focal airspace consolidation.  No blunting of costophrenic angles. No pneumothorax.  Mediastinal contours appear intact.  Degenerative changes in the thoracic spine with endplate compression deformities consistent with osteoporosis.  No significant change since previous study.  IMPRESSION: Emphysema and chronic bronchitic changes in the lungs.  No evidence of active pulmonary disease.  Original Report Authenticated By: Marlon Pel, M.D.   Dg Forearm Right  12/06/2011  *RADIOLOGY REPORT*  Clinical Data: Laceration over the wrist after fall.  RIGHT FOREARM - 2 VIEW  Comparison: None.  Findings: Right radius and ulna  appear intact. No evidence of acute fracture or subluxation.  No focal bone lesions.  Bone matrix and cortex appear intact.  No abnormal radiopaque densities in the soft tissues.  Degenerative changes in the carpal joints.  Benign- appearing cyst in the lunate.  IMPRESSION: No acute bony abnormalities.  No radiopaque foreign bodies. Degenerative changes in the wrist.  Original Report Authenticated By: Marlon Pel, M.D.   Ct Head Wo Contrast  12/06/2011  *RADIOLOGY REPORT*  Clinical Data:  Fall.  CT HEAD WITHOUT CONTRAST CT MAXILLOFACIAL  WITHOUT CONTRAST CT CERVICAL SPINE WITHOUT CONTRAST  Technique:  Multidetector CT imaging of the head, cervical spine, and maxillofacial structures were performed using the standard protocol without intravenous contrast. Multiplanar CT image reconstructions of the cervical spine and maxillofacial structures were also generated.  Comparison:  CT head 11/10/2006  CT HEAD  Findings: Diffuse cerebral atrophy.  Low attenuation change in the deep white matter consistent with small vessel ischemia. Ventricular dilatation consistent with central atrophy.  No mass effect or midline shift.  No abnormal extra-axial fluid collections.  Gray-white matter junctions are distinct.  Basal cisterns are not effaced.  No evidence of acute intracranial hemorrhage.  No significant change since previous study.  No depressed skull fractures.  Visualized paranasal sinuses and mastoid air cells are not opacified.  Vascular calcifications.  IMPRESSION: No acute intracranial abnormalities.  Chronic atrophy and small vessel ischemic change.  CT MAXILLOFACIAL  Findings:  Subcutaneous hematoma along the midline frontal region and extending down to the bridge of the nose.  No underlying skull fracture.  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  No acute air-fluid levels in the paranasal sinuses.  The globes and extraocular muscles appear intact and symmetrical.  Minimally depressed nasal bone fracture.  The nasal septum, nasal spine, orbital rims, maxillary antral walls, pterygoid plates, zygomatic arches, temporomandibular joints, and mandibles appear intact.  No displaced fractures identified.  IMPRESSION: Thickened bone and trabecular prominence in the left maxillary antral region consistent with chronic process such as fibrous dysplasia.  Midline frontal soft tissue hematoma.  Mildly displaced nasal bone fracture.  No orbital or facial  fractures.  CT CERVICAL SPINE  Findings:   Prominent degenerative changes throughout the cervical spine with narrowed cervical interspaces and endplate hypertrophic changes.  Degenerative changes throughout the facet joints. Degenerative changes at C1-2.  Mild anterior subluxation at C4-5 and C7-T1 consistent with degenerative change.  No significant vertebral compression deformities.  No prevertebral soft tissue swelling.  Lateral masses of C1 appear symmetrical.  The odontoid process appears intact.  No paraspinal mass.  Fibrosis in the lung apices.  IMPRESSION: Marked degenerative change throughout the cervical spine.  No displaced fractures identified.  Original Report Authenticated By: Marlon Pel, M.D.   Ct Cervical Spine Wo Contrast  12/06/2011  *RADIOLOGY REPORT*  Clinical Data:  Fall.  CT HEAD WITHOUT CONTRAST CT MAXILLOFACIAL WITHOUT CONTRAST CT CERVICAL SPINE WITHOUT CONTRAST  Technique:  Multidetector CT imaging of the head, cervical spine, and maxillofacial structures were performed using the standard protocol without intravenous contrast. Multiplanar CT image reconstructions of the cervical spine and maxillofacial structures were also generated.  Comparison:  CT head 11/10/2006  CT HEAD  Findings: Diffuse cerebral atrophy.  Low attenuation change in the deep white matter consistent with small vessel ischemia. Ventricular dilatation consistent with central atrophy.  No mass effect or midline shift.  No abnormal extra-axial fluid collections.  Gray-white matter junctions are distinct.  Basal cisterns are not effaced.  No evidence of acute intracranial hemorrhage.  No significant change since previous study.  No depressed skull fractures.  Visualized paranasal sinuses and mastoid air cells are not opacified.  Vascular calcifications.  IMPRESSION: No acute intracranial abnormalities.  Chronic atrophy and small vessel ischemic change.  CT MAXILLOFACIAL  Findings:  Subcutaneous hematoma along the  midline frontal region and extending down to the bridge of the nose.  No underlying skull fracture.  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  No acute air-fluid levels in the paranasal sinuses.  The globes and extraocular muscles appear intact and symmetrical.  Minimally depressed nasal bone fracture.  The nasal septum, nasal spine, orbital rims, maxillary antral walls, pterygoid plates, zygomatic arches, temporomandibular joints, and mandibles appear intact.  No displaced fractures identified.  IMPRESSION: Thickened bone and trabecular prominence in the left maxillary antral region consistent with chronic process such as fibrous dysplasia.  Midline frontal soft tissue hematoma.  Mildly displaced nasal bone fracture.  No orbital or facial fractures.  CT CERVICAL SPINE  Findings:   Prominent degenerative changes throughout the cervical spine with narrowed cervical interspaces and endplate hypertrophic changes.  Degenerative changes throughout the facet joints. Degenerative changes at C1-2.  Mild anterior subluxation at C4-5 and C7-T1 consistent with degenerative change.  No significant vertebral compression deformities.  No prevertebral soft tissue swelling.  Lateral masses of C1 appear symmetrical.  The odontoid process appears intact.  No paraspinal mass.  Fibrosis in the lung apices.  IMPRESSION: Marked degenerative change throughout the cervical spine.  No displaced fractures identified.  Original Report Authenticated By: Marlon Pel, M.D.   Ct Maxillofacial Wo Cm  12/06/2011  *RADIOLOGY REPORT*  Clinical Data:  Fall.  CT HEAD WITHOUT CONTRAST CT MAXILLOFACIAL WITHOUT CONTRAST CT CERVICAL SPINE WITHOUT CONTRAST  Technique:  Multidetector CT imaging of the head, cervical spine, and maxillofacial structures were performed using the standard protocol without intravenous contrast. Multiplanar CT image  reconstructions of the cervical spine and maxillofacial structures were also generated.  Comparison:  CT head 11/10/2006  CT HEAD  Findings: Diffuse cerebral atrophy.  Low attenuation change in the deep white matter consistent with small vessel ischemia. Ventricular dilatation consistent with central atrophy.  No mass effect or midline shift.  No abnormal extra-axial fluid collections.  Gray-white matter junctions are distinct.  Basal cisterns are not effaced.  No evidence of acute intracranial hemorrhage.  No significant change since previous study.  No depressed skull fractures.  Visualized paranasal sinuses and mastoid air cells are not opacified.  Vascular calcifications.  IMPRESSION: No acute intracranial abnormalities.  Chronic atrophy and small vessel ischemic change.  CT MAXILLOFACIAL  Findings:  Subcutaneous hematoma along the midline frontal region and extending down to the bridge of the nose.  No underlying skull fracture.  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  No acute air-fluid levels in the paranasal sinuses.  The globes and extraocular muscles appear intact and symmetrical.  Minimally depressed nasal bone fracture.  The nasal septum, nasal spine, orbital rims, maxillary antral walls, pterygoid plates, zygomatic arches, temporomandibular joints, and mandibles appear intact.  No displaced fractures identified.  IMPRESSION: Thickened bone and trabecular prominence in the left maxillary antral region consistent with chronic process such as fibrous dysplasia.  Midline frontal soft tissue hematoma.  Mildly displaced nasal bone fracture.  No orbital or facial fractures.  CT CERVICAL SPINE  Findings:   Prominent degenerative changes throughout the cervical spine with narrowed cervical interspaces and endplate hypertrophic changes.  Degenerative changes throughout the facet joints. Degenerative changes at  C1-2.  Mild anterior subluxation at C4-5 and C7-T1 consistent with degenerative change.  No significant vertebral compression deformities.  No prevertebral soft tissue swelling.  Lateral masses of C1 appear symmetrical.  The odontoid process appears intact.  No paraspinal mass.  Fibrosis in the lung apices.  IMPRESSION: Marked degenerative change throughout the cervical spine.  No displaced fractures identified.  Original Report Authenticated By: Marlon Pel, M.D.    EKG: Independently reviewed. Ventricular pacing   Assessment/Plan Present on Admission:  .FIBRILLATION, ATRIAL .PACEMAKER, PERMANENT .Edema .Hyponatremia .Syncope and collapse   PLAN:  We will admit her to telemetry, just to see if she developed tachyarrhythmia. She has a functioning pacemaker, and with beta blocker, she is paced. Because of her serum sodium a bit low, I will stop her diuretic, and will give her very gentle IV fluid. I will continue her other medications, except I will stop her benzodiazepine. We'll consult physical therapy to assess the stability of her gait. I also have discussed CODE STATUS, and confirmed that she is a DO NOT RESUSCITATE. We will honor wish. We'll admit her to telemetry, Under [facility].  Other plans as per orders.  Code Status: DNR   Houston Siren, MD. [facility] Pager [phone] 7pm to 7am.  12/06/2011, 6:21 AM

## 2011-12-06 NOTE — Evaluation (Signed)
Physical Therapy Evaluation Patient Details Name: ROSABELLA EDGIN MRN: [id number] DOB: 16-Dec-1919 Today's Date: 12/06/2011 Time: 4098-1191 PT Time Calculation (min): 26 min  PT Assessment / Plan / Recommendation Clinical Impression  pt presents s/p fall at home with Nasal fxs.  pt generally weak and debilitated.  Daughter present during session and at times voicing concerns about pt falling and level of A she may need at D/C.  Lengthy discussion about looking into SNF for pt if family feels uncomfortable continuing to care for pt at home.  Daughter seemed anxious when discussing home.      PT Assessment  Patient needs continued PT services    Follow Up Recommendations  Skilled nursing facility    Barriers to Discharge Decreased caregiver support Daughter seems anxious and unsure if she can care for pt at home.      Equipment Recommendations  Defer to next venue    Recommendations for Other Services     Frequency Min 3X/week    Precautions / Restrictions Precautions Precautions: Fall Restrictions Weight Bearing Restrictions: No   Pertinent Vitals/Pain Denies pain.        Mobility  Bed Mobility Bed Mobility: Supine to Sit;Sitting - Scoot to Edge of Bed Supine to Sit: 5: Supervision Sitting - Scoot to Edge of Bed: 3: Mod assist Details for Bed Mobility Assistance: Needs encouragement to attempt mobility without PT A.  Needs A and cueing for reciprocal scoot at EOB.   Transfers Transfers: Sit to Stand;Stand to Sit Sit to Stand: 4: Min assist;With upper extremity assist;From bed Stand to Sit: 4: Min assist;With upper extremity assist;To chair/3-in-1 Details for Transfer Assistance: pt with posterior lean despite cues and facilitation for anterior wt shift.  cues for UE use.   Ambulation/Gait Ambulation/Gait Assistance: 4: Min assist Ambulation Distance (Feet): 6 Feet Assistive device: Rolling walker Ambulation/Gait Assistance Details: pt with mild posterior lean during  gait.  L LE buckling requiring MinA to prevent fall.  cues for use of RW.   Gait Pattern: Step-through pattern;Decreased stride length;Trunk flexed (Posterior lean) Stairs: No Wheelchair Mobility Wheelchair Mobility: No    Exercises     PT Diagnosis: Difficulty walking  PT Problem List: Decreased strength;Decreased activity tolerance;Decreased balance;Decreased mobility;Decreased coordination;Decreased knowledge of use of DME;Decreased cognition PT Treatment Interventions: DME instruction;Gait training;Stair training;Functional mobility training;Therapeutic activities;Therapeutic exercise;Balance training;Patient/family education   PT Goals Acute Rehab PT Goals PT Goal Formulation: With patient/family Time For Goal Achievement: 12/20/11 Potential to Achieve Goals: Good Pt will go Supine/Side to Sit: with modified independence PT Goal: Supine/Side to Sit - Progress: Goal set today Pt will go Sit to Supine/Side: with modified independence PT Goal: Sit to Supine/Side - Progress: Goal set today Pt will go Sit to Stand: with supervision PT Goal: Sit to Stand - Progress: Goal set today Pt will go Stand to Sit: with supervision PT Goal: Stand to Sit - Progress: Goal set today Pt will Ambulate: 51 - 150 feet;with supervision;with rolling walker PT Goal: Ambulate - Progress: Goal set today Pt will Go Up / Down Stairs: 3-5 stairs;with min assist;with rail(s) PT Goal: Up/Down Stairs - Progress: Goal set today  Visit Information  Last PT Received On: 12/06/11 Assistance Needed: +1    Subjective Data  Subjective: "Oh!  I'm so worried she's going to fall."- Per daughter Patient Stated Goal: Get stronger   Prior Functioning  Home Living Lives With: Daughter Available Help at Discharge: Family;Available 24 hours/day Type of Home: House Home Access: Stairs to enter Entrance  Stairs-Number of Steps: 3 Entrance Stairs-Rails: Left Home Layout: Two level;Able to live on main level with  bedroom/bathroom Alternate Level Stairs-Number of Steps: Does not use upstairs Bathroom Toilet: Handicapped height Bathroom Accessibility: Yes How Accessible: Accessible via walker Home Adaptive Equipment: Bedside commode/3-in-1;Walker - rolling Prior Function Level of Independence: Needs assistance Needs Assistance: Meal Prep;Light Housekeeping;Gait;Transfers Meal Prep: Total Light Housekeeping: Total Gait Assistance: Uses RW Transfer Assistance: Pending surface height.   Able to Take Stairs?: Yes Driving: No Vocation: Retired Comments: pt sponge bathes at sink while sitting on 3-in-1, but doesn't have bucket for 3-in-1.  pt keeps one RW in bathroom, so she doesn't have to go through doorway with RW.   Communication Communication: No difficulties    Cognition  Overall Cognitive Status: History of cognitive impairments - at baseline Arousal/Alertness: Awake/alert Orientation Level: Disoriented to;Time;Situation Behavior During Session: WFL for tasks performed    Extremity/Trunk Assessment Right Lower Extremity Assessment RLE ROM/Strength/Tone: Deficits RLE ROM/Strength/Tone Deficits: Grossly 4-/5 RLE Sensation: WFL - Light Touch Left Lower Extremity Assessment LLE ROM/Strength/Tone: Deficits LLE ROM/Strength/Tone Deficits: Grossly 4-/5 LLE Sensation: WFL - Light Touch Trunk Assessment Trunk Assessment: Kyphotic   Balance Balance Balance Assessed: Yes Static Standing Balance Static Standing - Balance Support: Bilateral upper extremity supported Static Standing - Level of Assistance: 4: Min assist Static Standing - Comment/# of Minutes: pt able to stand with RW and posterior lean requiring MinA to maintain balance.    End of Session PT - End of Session Equipment Utilized During Treatment: Gait belt Activity Tolerance: Patient limited by fatigue Patient left: in chair;with call bell/phone within reach;with family/visitor present Nurse Communication: Mobility status  GP       Sunny Schlein, [place] [phone] 12/06/2011, 12:46 PM

## 2011-12-06 NOTE — ED Notes (Addendum)
Pt family states she fell while trying to get out of the bed to walk to her dresser. Pt denies pain, pt c/o nausea. Pt has bruise in middle of her forehead with mild swelling, pt has mild swelling and bruise to bilateral inner eye and nose, pt has a skin tear on her rt posterior fore arm. Cleaned skin skin  with normal saline, applied dry gauze.

## 2011-12-06 NOTE — ED Notes (Signed)
Pt is currently in radiology. 

## 2011-12-06 NOTE — Progress Notes (Signed)
Pt. Arrived to floor from ED.  Pt fearful and anxious about being admitted.  Pt stated "I don't understand why I have to be admitted." Pt pulled IV out and refuses tele.  Pt's dtr's stated "we are just going to take her home.  I don't want her to be anxious." refusing iv restart at this.

## 2011-12-07 DIAGNOSIS — E871 Hypo-osmolality and hyponatremia: Secondary | ICD-10-CM

## 2011-12-07 DIAGNOSIS — I369 Nonrheumatic tricuspid valve disorder, unspecified: Secondary | ICD-10-CM

## 2011-12-07 LAB — BASIC METABOLIC PANEL
BUN: 12 mg/dL (ref 6–23)
Calcium: 9.2 mg/dL (ref 8.4–10.5)
Creatinine, Ser: 0.96 mg/dL (ref 0.50–1.10)
GFR calc Af Amer: 59 mL/min — ABNORMAL LOW (ref >90)
GFR calc non Af Amer: 51 mL/min — ABNORMAL LOW (ref >90)
Glucose, Bld: 112 mg/dL — ABNORMAL HIGH (ref 70–99)
Potassium: 4.1 mEq/L (ref 3.5–5.1)

## 2011-12-07 LAB — CBC
HCT: 37 % (ref 36.0–46.0)
Hemoglobin: 12.8 g/dL (ref 12.0–15.0)
MCH: 32.2 pg (ref 26.0–34.0)
MCHC: 34.6 g/dL (ref 30.0–36.0)
RDW: 14 % (ref 11.5–15.5)

## 2011-12-07 MED ORDER — ALPRAZOLAM 0.25 MG PO TABS
0.2500 mg | ORAL_TABLET | ORAL | Status: AC
  Administered 2011-12-07: 0.25 mg via ORAL
  Filled 2011-12-07: qty 1

## 2011-12-07 NOTE — Progress Notes (Signed)
Physical Therapy Treatment Patient Details Name: Brandi [name] MRN: [id number] DOB: Nov 26, 1919 Today's Date: 12/07/2011 Time: 4098-1191 PT Time Calculation (min): 15 min  PT Assessment / Plan / Recommendation Comments on Treatment Session  Pt and daughters are ready to get pt home.  They seem comfortable with pt's mobility    Follow Up Recommendations  Home health PT    Barriers to Discharge        Equipment Recommendations  Defer to next venue    Recommendations for Other Services    Frequency     Plan Discharge plan needs to be updated    Precautions / Restrictions Precautions Precautions: Fall Restrictions Weight Bearing Restrictions: No   Pertinent Vitals/Pain     Mobility  Bed Mobility Bed Mobility: Not assessed Transfers Transfers: Sit to Stand;Stand to Sit Sit to Stand: 4: Min assist;With upper extremity assist;From bed Stand to Sit: 4: Min assist;With upper extremity assist;To chair/3-in-1 Details for Transfer Assistance: vc's for hand placement/squaring up to the chair before sitting; stability assist Ambulation/Gait Ambulation/Gait Assistance: 4: Min assist;4: Min guard Ambulation Distance (Feet): 50 Feet Assistive device: Standard walker Ambulation/Gait Assistance Details: pt is mildly unsteady through out with weak-kneed, internally Rotated hips bil. She has a tendency to list R and posteriorly and reinforced to daughters to watch her in that direction Gait Pattern: Step-through pattern;Decreased stride length;Trunk flexed Stairs: No    Exercises     PT Diagnosis:    PT Problem List:   PT Treatment Interventions:     PT Goals Acute Rehab PT Goals Time For Goal Achievement: 12/20/11 Potential to Achieve Goals: Good PT Goal: Sit to Stand - Progress: Progressing toward goal PT Goal: Stand to Sit - Progress: Progressing toward goal PT Goal: Ambulate - Progress: Progressing toward goal  Visit Information  Last PT Received On: 12/07/11 Assistance  Needed: +1    Subjective Data  Subjective: No, we're not worried, she's fine if you put on her shoes   Cognition  Overall Cognitive Status: History of cognitive impairments - at baseline Arousal/Alertness: Awake/alert Behavior During Session: [facility] for tasks performed    Balance  Static Standing Balance Static Standing - Balance Support: Bilateral upper extremity supported Static Standing - Level of Assistance: Other (comment) (min guard)  End of Session PT - End of Session Equipment Utilized During Treatment: Gait belt Activity Tolerance: Patient tolerated treatment well Patient left: in chair;with family/visitor present Nurse Communication: Mobility status   GP     Hedi Barkan, Eliseo Gum 12/07/2011, 5:16 PM  12/07/2011  [facility], PT [phone] (pager)

## 2011-12-07 NOTE — Progress Notes (Signed)
CSW received referral for transportation assistance and pt requiring non emergency ambulance transportation. Per discussion with pt MD, pt may be discharging home later today with pt daughters. CSW completed ambulance form and placed in [facility]. CSW informed rn that ambulance will need face sheet, and dc summary, and possible h&p.   Marland KitchenNo further Clinical Social Work needs, signing off.   Catha Gosselin, Theresia Majors  [phone] .12/07/2011 1529pm

## 2011-12-07 NOTE — Care Management Note (Signed)
    Page 1 of 2   12/07/2011     10:51:08 AM   CARE MANAGEMENT NOTE 12/07/2011  Patient:  Brandi [name], Brandi [name]   Account Number:  [id number]  Date Initiated:  12/06/2011  Documentation initiated by:  Donn Pierini  Subjective/Objective Assessment:   Pt admitted s/p ?syncope afib     Action/Plan:   PTA pt lived at home with children,   Anticipated DC Date:  12/08/2011   Anticipated DC Plan:  HOME/SELF CARE      DC Planning Services  CM consult      [facility] Choice  HOME HEALTH  DURABLE MEDICAL EQUIPMENT   Choice offered to / List presented to:  C-4 Adult Children   DME arranged  BEDSIDE COMMODE      DME agency  [facility]     HH arranged  HH-2 PT  HH-3 OT  HH-6 SOCIAL WORKER      HH agency  [facility]   Status of service:  Completed, signed off Medicare Important Message given?   (If response is "NO", the following Medicare IM given date fields will be blank) Date Medicare IM given:   Date Additional Medicare IM given:    Discharge Disposition:  HOME W HOME HEALTH SERVICES  Per UR Regulation:  Reviewed for med. necessity/level of care/duration of stay  If discussed at Long Length of Stay Meetings, dates discussed:    Comments:  PCP- Lorenda Peck  12/07/11- 1000- Donn Pierini RN, BSN [phone] Pt for discharge home today, orders placed for HH-PT/OT/CSW and [facility] - referral sent to [facility] via [facility] and call made to [facility] with [facility] regarding referral, Jill Alexanders with [facility] also called and message left regarding need for [facility] prior to discharge. HH services to begin within 24-48 post discharge. Pt will need ambulance transportation home, CSW consulted.  12/06/11- 1500- Donn Pierini RN,  BSN [phone] Spoke with pt and daughter at bedside- PT/OT recommending SNF- pt/family refusing and want to go home with [facility]- list of agencies for [facility] given to pt's daughter- they would like to use [facility] per choice for [facility] and DME needs. MD to order HH-PT/OT/CSW  and BSC at discharge. will make referral when orders placed

## 2011-12-07 NOTE — Progress Notes (Signed)
  Echocardiogram 2D Echocardiogram has been performed.  Brandi [name] 12/07/2011, 2:05 PM

## 2011-12-07 NOTE — Progress Notes (Signed)
   CARE MANAGEMENT NOTE 12/07/2011  Patient:  Brandi [name], Brandi [name]   Account Number:  [id number]  Date Initiated:  12/06/2011  Documentation initiated by:  Donn Pierini  Subjective/Objective Assessment:   Pt admitted s/p ?syncope afib     Action/Plan:   PTA pt lived at home with children,   Anticipated DC Date:  12/08/2011   Anticipated DC Plan:  HOME/SELF CARE      DC Planning Services  CM consult      [facility] Choice  HOME HEALTH  DURABLE MEDICAL EQUIPMENT   Choice offered to / List presented to:  C-4 Adult Children   DME arranged  BEDSIDE COMMODE      DME agency  [facility]     HH arranged  HH-2 PT  HH-3 OT  HH-6 SOCIAL WORKER      HH agency  [facility]   Status of service:  Completed, signed off Medicare Important Message given?   (If response is "NO", the following Medicare IM given date fields will be blank) Date Medicare IM given:   Date Additional Medicare IM given:    Discharge Disposition:  HOME W HOME HEALTH SERVICES  Per UR Regulation:  Reviewed for med. necessity/level of care/duration of stay  If discussed at Long Length of Stay Meetings, dates discussed:    Comments:  PCP- Lorenda Peck  12/07/11- 1000- Donn Pierini RN, BSN [phone] Pt for discharge home today, orders placed for HH-PT/OT/CSW and [facility] - referral sent to [facility] via [facility] and call made to [facility] with [facility] regarding referral, Jill Alexanders with [facility] also called and message left regarding need for [facility] prior to discharge. HH services to begin within 24-48 post discharge. Pt will need ambulance transportation home, CSW consulted.  12/06/11- 1500- Donn Pierini RN,  BSN [phone] Spoke with pt and daughter at bedside- PT/OT recommending SNF- pt/family refusing and want to go home with [facility]- list of agencies for [facility] given to pt's daughter- they would like to use [facility] per choice for [facility] and DME needs. MD to order HH-PT/OT/CSW and BSC at discharge. will make referral when  orders placed

## 2011-12-07 NOTE — Progress Notes (Signed)
HOME HEALTH AGENCIES SERVING [place]   Agencies that are Medicare-Certified and are affiliated with [facility]  Telephone Number Address  [facility]   [facility] has ownership interest in this company; however, you are under no obligation to use this agency. [phone] or  [phone] [address] [place], [place] [address]   Agencies that are Medicare-Certified and are not affiliated with [facility]                                                                                 Home Health Agency Telephone Number Address  [facility] [phone] Fax [phone] [address] [place], [place]  [address]  [facility] [phone] or [phone] Fax [phone] [address] [place], [place] [address]  [facility] [phone] Fax [phone] [address] [place], [place] [address]  [facility] [phone] Fax [phone] [address], [address] [place], [place]  [address]  [facility] [phone] Fax [phone] [address] [place], [place] [address]  [facility] [phone] [address] [place], [place] [address]  [facility] [phone]  [address] [place], [place] [address]  [facility] [phone] or [phone] Fax [phone] [address] [place], [place]  [address]  [facility] [phone] Fax [phone] [address] [place], [place]  [address]  [facility]  [phone] Fax [phone] [address] [place], [place] [address]               Agencies that are not Medicare-Certified and are not affiliated with [facility]   Home Health Agency Telephone Number Address  [facility] [phone] or [phone] Fax [phone] [address] [place], [place]  [address]  [facility] [phone] Fax [phone] [address] [place], [place]  [address]  [facility]  [phone] Fax [phone] [address] [place], [place] [address]  [facility] [phone] Fax [phone] [address] [place], [place] [address]  [facility] [phone] or [phone] Fax [phone] [address] [place], [place]  [address]  [facility] [phone] or [phone] Fax [phone] [address] [place], [place]  [address]  [facility] [phone] Fax [phone] [address] [place], [place]  [address]  [facility] [phone] [address] [place], [place]  [address]  [facility] [phone] Fax [phone] [address] [place], [place]  [address]  [facility] [phone] Fax [phone] [address] [place],   [address]  [facility] [phone] Fax [phone] [address] [place],  [address]  [facility] [phone] Fax [phone] [address] [place],   [address]   

## 2011-12-07 NOTE — Discharge Summary (Signed)
Physician Discharge Summary  ZORI BENBROOK FAO:[id number] DOB: 07-Apr-1920 DOA: 12/05/2011  PCP: Lorenda Peck, MD  Admit date: 12/05/2011 Discharge date: 12/07/2011  Recommendations for Outpatient Follow-up:  1. Home health  Discharge Diagnoses:  Principal Problem:  *Fall at home Active Problems:  FIBRILLATION, ATRIAL  Edema  PACEMAKER, PERMANENT  Hyponatremia  Syncope and collapse   Discharge Condition: improved  Diet recommendation: regular  Wt Readings from Last 3 Encounters:  12/06/11 82.464 kg (181 lb 12.8 oz)  01/17/11 72.576 kg (160 lb)  07/18/10 72.576 kg (160 lb)    History of present illness:  Brandi [name] is an 78 y.o. female with history of tachybradycardia syndrome status post permanent pacemaker placement, hypertension, anxiety, brought in by her 2 daughters because she fell, lost consciousness, and fractured her nose. It was unclear as to whether or not she tripped and fell, and because of the impact loss consciousness, or had a syncopal episode. Evaluation in emergency room included a head CT, which showed the nasal fracture, but no intracranial hemorrhage, neck CT was unremarkable. Her EKG showed ventricular paced rhythm. Her daughters were quite unsure about whether or not to take her home AMA, or have her admitted. After consideration, they agreed to have admitted him for observation. She was also found to have mild hyponatremia with serum sodium of 129, and has been on diuretic   Hospital Course:  Patient admitted after a mechanical fall. CT scan showed nasal fracture.  Echo done was WNL.  PT saw patient and they recommended SNF- patient and family declined stating they have 24 hour care at home.  They would be willing to have home health come in.  Patient remained stable on tele with no further complications.    Consultations:  PT  Discharge Exam: Filed Vitals:   12/07/11 1339  BP: 138/61  Pulse: 82  Temp:   Resp:    Filed Vitals:   12/06/11 1400 12/06/11 2100 12/07/11 0500 12/07/11 1339  BP: 145/81 164/81 138/77 138/61  Pulse: 74 62 64 82  Temp: 98.2 F (36.8 C) 97.9 F (36.6 C) 98.1 F (36.7 C)   TempSrc:      Resp: 17 18 18    Height:      Weight:      SpO2: 96% 92% 94%     General: pleasant and cooperative Cardiovascular: rrr Respiratory: clear anterior GI: +BS, soft, NT/ND  Discharge Instructions  Discharge Orders    Future Appointments: Provider: Department: Dept Phone: Center:   01/30/2012 11:00 AM Duke Salvia, MD [facility] [phone] [facility]     Future Orders Please Complete By Expires   Diet general      Increase activity slowly      Discharge instructions      Comments:   Home health- PT/OT/RN/social worker 24 hour care Bedside commode given     Medication List  As of 12/07/2011  4:30 PM   STOP taking these medications         amLODipine 5 MG tablet         TAKE these medications         ALPRAZolam 0.25 MG tablet   Commonly known as: XANAX   Take 0.25 mg by mouth at bedtime as needed. For anxiety      furosemide 20 MG tablet   Commonly known as: LASIX   TAKE  1/2 TABLET BY MOUTH EVERY DAY      metoprolol 100 MG tablet   Commonly known  as: LOPRESSOR   Take 1 tablet (100 mg total) by mouth 2 (two) times daily.           Follow-up Information    Follow up with ROBERTS, Vernie Ammons, MD in 1 week. (BMP re Na)    Contact information:   [address] [place] [address] [phone]           The results of significant diagnostics from this hospitalization (including imaging, microbiology, ancillary and laboratory) are listed below for reference.    Significant Diagnostic Studies: Dg Chest 2 View  12/06/2011  *RADIOLOGY REPORT*  Clinical Data: Fall.  Nausea.  CHEST - 2 VIEW  Comparison: 11/07/2006  Findings: Stable appearance of cardiac pacemakers since previous study.  Borderline heart size with normal pulmonary vascularity.  Emphysematous changes and scattered fibrosis in the lungs.  No focal airspace consolidation.  No blunting of costophrenic angles. No pneumothorax.  Mediastinal contours appear intact.  Degenerative changes in the thoracic spine with endplate compression deformities consistent with osteoporosis.  No significant change since previous study.  IMPRESSION: Emphysema and chronic bronchitic changes in the lungs.  No evidence of active pulmonary disease.  Original Report Authenticated By: Marlon Pel, M.D.   Dg Forearm Right  12/06/2011  *RADIOLOGY REPORT*  Clinical Data: Laceration over the wrist after fall.  RIGHT FOREARM - 2 VIEW  Comparison: None.  Findings: Right radius and ulna appear intact. No evidence of acute fracture or subluxation.  No focal bone lesions.  Bone matrix and cortex appear intact.  No abnormal radiopaque densities in the soft tissues.  Degenerative changes in the carpal joints.  Benign- appearing cyst in the lunate.  IMPRESSION: No acute bony abnormalities.  No radiopaque foreign bodies. Degenerative changes in the wrist.  Original Report Authenticated By: Marlon Pel, M.D.   Ct Head Wo Contrast  12/06/2011  *RADIOLOGY REPORT*  Clinical Data:  Fall.  CT HEAD WITHOUT CONTRAST CT MAXILLOFACIAL WITHOUT CONTRAST CT CERVICAL SPINE WITHOUT CONTRAST  Technique:  Multidetector CT imaging of the head, cervical spine, and maxillofacial structures were performed using the standard protocol without intravenous contrast. Multiplanar CT image reconstructions of the cervical spine and maxillofacial structures were also generated.  Comparison:  CT head 11/10/2006  CT HEAD  Findings: Diffuse cerebral atrophy.  Low attenuation change in the deep white matter consistent with small vessel ischemia. Ventricular dilatation consistent with central atrophy.  No mass effect or midline shift.  No abnormal extra-axial fluid collections.  Gray-white matter junctions are distinct.  Basal cisterns are not  effaced.  No evidence of acute intracranial hemorrhage.  No significant change since previous study.  No depressed skull fractures.  Visualized paranasal sinuses and mastoid air cells are not opacified.  Vascular calcifications.  IMPRESSION: No acute intracranial abnormalities.  Chronic atrophy and small vessel ischemic change.  CT MAXILLOFACIAL  Findings:  Subcutaneous hematoma along the midline frontal region and extending down to the bridge of the nose.  No underlying skull fracture.  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  No acute air-fluid levels in the paranasal sinuses.  The globes and extraocular muscles appear intact and symmetrical.  Minimally depressed nasal bone fracture.  The nasal septum, nasal spine, orbital rims, maxillary antral walls, pterygoid plates, zygomatic arches, temporomandibular joints, and mandibles appear intact.  No displaced fractures identified.  IMPRESSION: Thickened bone and trabecular prominence in the left maxillary antral region  consistent with chronic process such as fibrous dysplasia.  Midline frontal soft tissue hematoma.  Mildly displaced nasal bone fracture.  No orbital or facial fractures.  CT CERVICAL SPINE  Findings:   Prominent degenerative changes throughout the cervical spine with narrowed cervical interspaces and endplate hypertrophic changes.  Degenerative changes throughout the facet joints. Degenerative changes at C1-2.  Mild anterior subluxation at C4-5 and C7-T1 consistent with degenerative change.  No significant vertebral compression deformities.  No prevertebral soft tissue swelling.  Lateral masses of C1 appear symmetrical.  The odontoid process appears intact.  No paraspinal mass.  Fibrosis in the lung apices.  IMPRESSION: Marked degenerative change throughout the cervical spine.  No displaced fractures identified.  Original Report Authenticated By:  Marlon Pel, M.D.   Ct Cervical Spine Wo Contrast  12/06/2011  *RADIOLOGY REPORT*  Clinical Data:  Fall.  CT HEAD WITHOUT CONTRAST CT MAXILLOFACIAL WITHOUT CONTRAST CT CERVICAL SPINE WITHOUT CONTRAST  Technique:  Multidetector CT imaging of the head, cervical spine, and maxillofacial structures were performed using the standard protocol without intravenous contrast. Multiplanar CT image reconstructions of the cervical spine and maxillofacial structures were also generated.  Comparison:  CT head 11/10/2006  CT HEAD  Findings: Diffuse cerebral atrophy.  Low attenuation change in the deep white matter consistent with small vessel ischemia. Ventricular dilatation consistent with central atrophy.  No mass effect or midline shift.  No abnormal extra-axial fluid collections.  Gray-white matter junctions are distinct.  Basal cisterns are not effaced.  No evidence of acute intracranial hemorrhage.  No significant change since previous study.  No depressed skull fractures.  Visualized paranasal sinuses and mastoid air cells are not opacified.  Vascular calcifications.  IMPRESSION: No acute intracranial abnormalities.  Chronic atrophy and small vessel ischemic change.  CT MAXILLOFACIAL  Findings:  Subcutaneous hematoma along the midline frontal region and extending down to the bridge of the nose.  No underlying skull fracture.  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  No acute air-fluid levels in the paranasal sinuses.  The globes and extraocular muscles appear intact and symmetrical.  Minimally depressed nasal bone fracture.  The nasal septum, nasal spine, orbital rims, maxillary antral walls, pterygoid plates, zygomatic arches, temporomandibular joints, and mandibles appear intact.  No displaced fractures identified.  IMPRESSION: Thickened bone and trabecular prominence in the left maxillary antral region  consistent with chronic process such as fibrous dysplasia.  Midline frontal soft tissue hematoma.  Mildly displaced nasal bone fracture.  No orbital or facial fractures.  CT CERVICAL SPINE  Findings:   Prominent degenerative changes throughout the cervical spine with narrowed cervical interspaces and endplate hypertrophic changes.  Degenerative changes throughout the facet joints. Degenerative changes at C1-2.  Mild anterior subluxation at C4-5 and C7-T1 consistent with degenerative change.  No significant vertebral compression deformities.  No prevertebral soft tissue swelling.  Lateral masses of C1 appear symmetrical.  The odontoid process appears intact.  No paraspinal mass.  Fibrosis in the lung apices.  IMPRESSION: Marked degenerative change throughout the cervical spine.  No displaced fractures identified.  Original Report Authenticated By: Marlon Pel, M.D.   Ct Maxillofacial Wo Cm  12/06/2011  *RADIOLOGY REPORT*  Clinical Data:  Fall.  CT HEAD WITHOUT CONTRAST CT MAXILLOFACIAL WITHOUT CONTRAST CT CERVICAL SPINE WITHOUT CONTRAST  Technique:  Multidetector CT imaging of the head, cervical spine, and maxillofacial structures were performed using the standard protocol without intravenous contrast.  Multiplanar CT image reconstructions of the cervical spine and maxillofacial structures were also generated.  Comparison:  CT head 11/10/2006  CT HEAD  Findings: Diffuse cerebral atrophy.  Low attenuation change in the deep white matter consistent with small vessel ischemia. Ventricular dilatation consistent with central atrophy.  No mass effect or midline shift.  No abnormal extra-axial fluid collections.  Gray-white matter junctions are distinct.  Basal cisterns are not effaced.  No evidence of acute intracranial hemorrhage.  No significant change since previous study.  No depressed skull fractures.  Visualized paranasal sinuses and mastoid air cells are not opacified.  Vascular calcifications.  IMPRESSION:  No acute intracranial abnormalities.  Chronic atrophy and small vessel ischemic change.  CT MAXILLOFACIAL  Findings:  Subcutaneous hematoma along the midline frontal region and extending down to the bridge of the nose.  No underlying skull fracture.  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  No acute air-fluid levels in the paranasal sinuses.  The globes and extraocular muscles appear intact and symmetrical.  Minimally depressed nasal bone fracture.  The nasal septum, nasal spine, orbital rims, maxillary antral walls, pterygoid plates, zygomatic arches, temporomandibular joints, and mandibles appear intact.  No displaced fractures identified.  IMPRESSION: Thickened bone and trabecular prominence in the left maxillary antral region consistent with chronic process such as fibrous dysplasia.  Midline frontal soft tissue hematoma.  Mildly displaced nasal bone fracture.  No orbital or facial fractures.  CT CERVICAL SPINE  Findings:   Prominent degenerative changes throughout the cervical spine with narrowed cervical interspaces and endplate hypertrophic changes.  Degenerative changes throughout the facet joints. Degenerative changes at C1-2.  Mild anterior subluxation at C4-5 and C7-T1 consistent with degenerative change.  No significant vertebral compression deformities.  No prevertebral soft tissue swelling.  Lateral masses of C1 appear symmetrical.  The odontoid process appears intact.  No paraspinal mass.  Fibrosis in the lung apices.  IMPRESSION: Marked degenerative change throughout the cervical spine.  No displaced fractures identified.  Original Report Authenticated By: Marlon Pel, M.D.    Microbiology: No results found for this or any previous visit (from the past 240 hour(s)).   Labs: Basic Metabolic Panel:  Lab 12/07/11 1610 12/05/11 2355  NA 129* 129*  K 4.1 4.2  CL 97 92*  CO2 25 25    GLUCOSE 112* 131*  BUN 12 17  CREATININE 0.96 0.95  CALCIUM 9.2 9.7  MG -- --  PHOS -- --   Liver Function Tests: No results found for this basename: AST:5,ALT:5,ALKPHOS:5,BILITOT:5,PROT:5,ALBUMIN:5 in the last 168 hours No results found for this basename: LIPASE:5,AMYLASE:5 in the last 168 hours No results found for this basename: AMMONIA:5 in the last 168 hours CBC:  Lab 12/07/11 0550 12/05/11 2355  WBC 7.7 8.0  NEUTROABS -- --  HGB 12.8 13.3  HCT 37.0 38.3  MCV 93.0 93.9  PLT 162 211   Cardiac Enzymes: No results found for this basename: CKTOTAL:5,CKMB:5,CKMBINDEX:5,TROPONINI:5 in the last 168 hours BNP: BNP (last 3 results) No results found for this basename: PROBNP:3 in the last 8760 hours CBG: No results found for this basename: GLUCAP:5 in the last 168 hours  Time coordinating discharge: 35 minutes  Signed:  Marlin Canary  [facility] 12/07/2011, 4:30 PM

## 2012-01-17 ENCOUNTER — Encounter: Payer: Self-pay | Admitting: *Deleted

## 2012-01-23 ENCOUNTER — Telehealth: Payer: Self-pay | Admitting: Internal Medicine

## 2012-01-23 NOTE — Telephone Encounter (Signed)
Spoke w/daughter and cancelled 01-30-12 appt and rescheduled to a remote check on 02-05-12. Family is having ramp built on house and will be much easier for pt to come in once ramp is complete. Daughter to call back and reschedule appt with SK once ramp is finished. Daughter is aware to send transmission on 02-05-12.

## 2012-01-23 NOTE — Telephone Encounter (Signed)
Will forward to device clinic  

## 2012-01-23 NOTE — Telephone Encounter (Signed)
New Problem:    Called in because her mother is having issues walking and she wanted to know if her mother would be able to transmit remotely for her up coming visit.  Please call back.

## 2012-01-30 ENCOUNTER — Encounter: Payer: Medicare Other | Admitting: Internal Medicine

## 2012-02-05 ENCOUNTER — Ambulatory Visit (INDEPENDENT_AMBULATORY_CARE_PROVIDER_SITE_OTHER): Payer: Medicare Other | Admitting: *Deleted

## 2012-02-05 ENCOUNTER — Encounter: Payer: Self-pay | Admitting: Internal Medicine

## 2012-02-05 DIAGNOSIS — I4891 Unspecified atrial fibrillation: Secondary | ICD-10-CM

## 2012-02-05 DIAGNOSIS — Z950 Presence of cardiac pacemaker: Secondary | ICD-10-CM

## 2012-02-08 LAB — REMOTE PACEMAKER DEVICE
BATTERY VOLTAGE: 2.77 V
BMOD-0003RV: 30
BRDY-0002RV: 60 {beats}/min
BRDY-0004RV: 130 {beats}/min
RV LEAD IMPEDENCE PM: 654 Ohm
RV LEAD THRESHOLD: 0.625 V

## 2012-03-01 ENCOUNTER — Encounter: Payer: Self-pay | Admitting: *Deleted

## 2012-04-11 ENCOUNTER — Other Ambulatory Visit: Payer: Self-pay

## 2012-04-11 MED ORDER — METOPROLOL TARTRATE 100 MG PO TABS: 100.0000 mg | ORAL_TABLET | Freq: Two times a day (BID) | ORAL | Status: DC

## 2012-07-10 ENCOUNTER — Ambulatory Visit (INDEPENDENT_AMBULATORY_CARE_PROVIDER_SITE_OTHER): Payer: Medicare Other | Admitting: Internal Medicine

## 2012-07-10 ENCOUNTER — Encounter: Payer: Self-pay | Admitting: Internal Medicine

## 2012-07-10 VITALS — BP 182/87 | HR 76 | Ht 69.0 in | Wt 165.0 lb

## 2012-07-10 DIAGNOSIS — R5381 Other malaise: Secondary | ICD-10-CM

## 2012-07-10 DIAGNOSIS — R55 Syncope and collapse: Secondary | ICD-10-CM

## 2012-07-10 DIAGNOSIS — Z950 Presence of cardiac pacemaker: Secondary | ICD-10-CM

## 2012-07-10 DIAGNOSIS — R531 Weakness: Secondary | ICD-10-CM | POA: Insufficient documentation

## 2012-07-10 DIAGNOSIS — I4891 Unspecified atrial fibrillation: Secondary | ICD-10-CM

## 2012-07-10 LAB — PACEMAKER DEVICE OBSERVATION
BATTERY VOLTAGE: 2.76 V
RV LEAD IMPEDENCE PM: 616 Ohm
VENTRICULAR PACING PM: 82

## 2012-07-10 MED ORDER — METOPROLOL TARTRATE 100 MG PO TABS: 100.0000 mg | ORAL_TABLET | Freq: Two times a day (BID) | ORAL | Status: AC

## 2012-07-10 NOTE — Progress Notes (Signed)
Patient Care Team: Burton Apley, MD as PCP - General   HPI  Brandi [name] is a 79 y.o. female Seen in followup for her tachybradycardia syndrome permanent atrial fibrillation and prior pacemaker implantation. She has a prior history of syncope  She is now largely nonambulatory. She is able to stand up with some assistance. She has a bad knee. She is also having urinary frequency and poor bladder control. She does not have a PCP  Past Medical History  Diagnosis Date  . Atrial fibrillation   . Tachy-brady syndrome     PPM  . Syncope     PPM  . Digoxin toxicity   . Osteoarthritis   . Anxiety   . HTN (hypertension)   . Orthostasis   . COPD (chronic obstructive pulmonary disease)   . Tricuspid regurgitation   . Edema     Past Surgical History  Procedure Laterality Date  . Total hip arthroplasty    . Back surgery  2001  . Pacemaker insertion  2008    Current Outpatient Prescriptions  Medication Sig Dispense Refill  . ALPRAZolam (XANAX) 0.25 MG tablet Take 0.25 mg by mouth at bedtime as needed. For anxiety      . metoprolol (LOPRESSOR) 100 MG tablet Take 1 tablet (100 mg total) by mouth 2 (two) times daily.  180 tablet  0   No current facility-administered medications for this visit.    Allergies  Allergen Reactions  . Codeine Phosphate     REACTION: unspecified  . Penicillins     REACTION: unspecified  . Sulfonamide Derivatives     Review of Systems negative except from HPI and PMH  Physical Exam Ht 5\' 9"  (1.753 m) BP 182/87  Pulse 76  Ht 5\' 9"  (1.753 m)  Wt 165 lb (74.844 kg)  BMI 24.36 kg/m2 We're currently in Well developed and nourished in no acute distress sitting in wheelchair HENT normal Neck supple  Device pocket well healed; without hematoma or erythema Clear Regular rate and rhythm, no murmurs or gallops Abd-soft with active BS No Clubbing cyanosis edema Skin-warm and dry A & Oriented  Grossly normal sensory and motor  function     Assessment and  Plan

## 2012-07-10 NOTE — Assessment & Plan Note (Signed)
Poorly controlled  Continue currrent meds

## 2012-07-10 NOTE — Patient Instructions (Addendum)
Remote monitoring is used to monitor your Pacemaker of ICD from home. This monitoring reduces the number of office visits required to check your device to one time per year. It allows [place] to keep an eye on the functioning of your device to ensure it is working properly. You are scheduled for a device check from home on 10-14-2012. You may send your transmission at any time that day. If you have a wireless device, the transmission will be sent automatically. After your physician reviews your transmission, you will receive a postcard with your next transmission date.

## 2012-07-10 NOTE — Assessment & Plan Note (Signed)
The patient's device was interrogated.  The information was reviewed. No changes were made in the programming.    

## 2012-07-10 NOTE — Assessment & Plan Note (Signed)
Permanent not on anticoagulation

## 2012-07-10 NOTE — Assessment & Plan Note (Signed)
Have offered to ry and help with PCP referral to see if anything can be done with urniary frequency and with PT for weakness and help with transfer

## 2012-08-20 ENCOUNTER — Inpatient Hospital Stay (HOSPITAL_COMMUNITY)
Admission: EM | Admit: 2012-08-20 | Discharge: 2012-08-24 | DRG: 689 | Disposition: A | Discharge status: Skilled Nursing Facility | Payer: Medicare Other | Attending: Internal Medicine | Admitting: Internal Medicine

## 2012-08-20 DIAGNOSIS — I4891 Unspecified atrial fibrillation: Secondary | ICD-10-CM

## 2012-08-20 DIAGNOSIS — J4489 Other specified chronic obstructive pulmonary disease: Secondary | ICD-10-CM | POA: Diagnosis present

## 2012-08-20 DIAGNOSIS — N289 Disorder of kidney and ureter, unspecified: Secondary | ICD-10-CM

## 2012-08-20 DIAGNOSIS — G9340 Encephalopathy, unspecified: Secondary | ICD-10-CM

## 2012-08-20 DIAGNOSIS — M159 Polyosteoarthritis, unspecified: Secondary | ICD-10-CM | POA: Diagnosis present

## 2012-08-20 DIAGNOSIS — I2510 Atherosclerotic heart disease of native coronary artery without angina pectoris: Secondary | ICD-10-CM | POA: Diagnosis present

## 2012-08-20 DIAGNOSIS — R531 Weakness: Secondary | ICD-10-CM

## 2012-08-20 DIAGNOSIS — W19XXXA Unspecified fall, initial encounter: Secondary | ICD-10-CM

## 2012-08-20 DIAGNOSIS — R410 Disorientation, unspecified: Secondary | ICD-10-CM

## 2012-08-20 DIAGNOSIS — F0390 Unspecified dementia without behavioral disturbance: Secondary | ICD-10-CM | POA: Diagnosis present

## 2012-08-20 DIAGNOSIS — Y92009 Unspecified place in unspecified non-institutional (private) residence as the place of occurrence of the external cause: Secondary | ICD-10-CM

## 2012-08-20 DIAGNOSIS — Z950 Presence of cardiac pacemaker: Secondary | ICD-10-CM

## 2012-08-20 DIAGNOSIS — E871 Hypo-osmolality and hyponatremia: Secondary | ICD-10-CM

## 2012-08-20 DIAGNOSIS — J449 Chronic obstructive pulmonary disease, unspecified: Secondary | ICD-10-CM | POA: Diagnosis present

## 2012-08-20 DIAGNOSIS — G9341 Metabolic encephalopathy: Secondary | ICD-10-CM | POA: Diagnosis present

## 2012-08-20 DIAGNOSIS — N390 Urinary tract infection, site not specified: Principal | ICD-10-CM

## 2012-08-20 DIAGNOSIS — R5383 Other fatigue: Secondary | ICD-10-CM | POA: Diagnosis present

## 2012-08-20 DIAGNOSIS — I10 Essential (primary) hypertension: Secondary | ICD-10-CM

## 2012-08-20 DIAGNOSIS — F411 Generalized anxiety disorder: Secondary | ICD-10-CM | POA: Diagnosis present

## 2012-08-20 DIAGNOSIS — Z66 Do not resuscitate: Secondary | ICD-10-CM | POA: Diagnosis present

## 2012-08-20 DIAGNOSIS — N179 Acute kidney failure, unspecified: Secondary | ICD-10-CM

## 2012-08-20 DIAGNOSIS — R4182 Altered mental status, unspecified: Secondary | ICD-10-CM | POA: Diagnosis present

## 2012-08-20 DIAGNOSIS — R5381 Other malaise: Secondary | ICD-10-CM | POA: Diagnosis present

## 2012-08-20 DIAGNOSIS — D72829 Elevated white blood cell count, unspecified: Secondary | ICD-10-CM

## 2012-08-20 DIAGNOSIS — W1811XA Fall from or off toilet without subsequent striking against object, initial encounter: Secondary | ICD-10-CM | POA: Diagnosis present

## 2012-08-20 DIAGNOSIS — E860 Dehydration: Secondary | ICD-10-CM

## 2012-08-20 NOTE — ED Notes (Signed)
Brought in by EMS with c/o altered mental status and urinary frequency.  Per EMS, pt's family reported that pt has been confused and disoriented since 4 days ago-- also reported that she has been "peeing on herself".

## 2012-08-20 NOTE — ED Notes (Signed)
Bed:WA10<BR> Expected date:<BR> Expected time:<BR> Means of arrival:<BR> Comments:<BR> EMS

## 2012-08-21 ENCOUNTER — Other Ambulatory Visit: Payer: Self-pay

## 2012-08-21 ENCOUNTER — Encounter (HOSPITAL_COMMUNITY): Payer: Self-pay | Admitting: Emergency Medicine

## 2012-08-21 DIAGNOSIS — R5381 Other malaise: Secondary | ICD-10-CM

## 2012-08-21 DIAGNOSIS — N179 Acute kidney failure, unspecified: Secondary | ICD-10-CM | POA: Diagnosis present

## 2012-08-21 DIAGNOSIS — D72829 Elevated white blood cell count, unspecified: Secondary | ICD-10-CM | POA: Diagnosis present

## 2012-08-21 DIAGNOSIS — R4182 Altered mental status, unspecified: Secondary | ICD-10-CM | POA: Diagnosis present

## 2012-08-21 DIAGNOSIS — G9340 Encephalopathy, unspecified: Secondary | ICD-10-CM | POA: Diagnosis present

## 2012-08-21 DIAGNOSIS — N390 Urinary tract infection, site not specified: Secondary | ICD-10-CM | POA: Diagnosis present

## 2012-08-21 DIAGNOSIS — I10 Essential (primary) hypertension: Secondary | ICD-10-CM | POA: Diagnosis present

## 2012-08-21 LAB — BASIC METABOLIC PANEL
BUN: 22 mg/dL (ref 6–23)
Calcium: 9.5 mg/dL (ref 8.4–10.5)
Chloride: 92 mEq/L — ABNORMAL LOW (ref 96–112)
GFR calc Af Amer: 36 mL/min — ABNORMAL LOW (ref >90)
GFR calc non Af Amer: 29 mL/min — ABNORMAL LOW (ref >90)
GFR calc non Af Amer: 31 mL/min — ABNORMAL LOW (ref >90)
Glucose, Bld: 211 mg/dL — ABNORMAL HIGH (ref 70–99)
Potassium: 4.3 mEq/L (ref 3.5–5.1)
Sodium: 127 mEq/L — ABNORMAL LOW (ref 135–145)

## 2012-08-21 LAB — URINALYSIS, ROUTINE W REFLEX MICROSCOPIC
Bilirubin Urine: NEGATIVE
Ketones, ur: NEGATIVE mg/dL
Specific Gravity, Urine: 1.017 (ref 1.005–1.030)
Urobilinogen, UA: 1 mg/dL (ref 0.0–1.0)

## 2012-08-21 LAB — CBC WITH DIFFERENTIAL/PLATELET
Basophils Absolute: 0 10*3/uL (ref 0.0–0.1)
Basophils Relative: 0 % (ref 0–1)
Eosinophils Absolute: 0 10*3/uL (ref 0.0–0.7)
Eosinophils Relative: 0 % (ref 0–5)
HCT: 39.7 % (ref 36.0–46.0)
Hemoglobin: 13.4 g/dL (ref 12.0–15.0)
Hemoglobin: 14.2 g/dL (ref 12.0–15.0)
Lymphocytes Relative: 4 % — ABNORMAL LOW (ref 12–46)
Lymphs Abs: 0.5 10*3/uL — ABNORMAL LOW (ref 0.7–4.0)
MCH: 33.5 pg (ref 26.0–34.0)
MCHC: 34.2 g/dL (ref 30.0–36.0)
MCV: 98 fL (ref 78.0–100.0)
Monocytes Absolute: 2.1 10*3/uL — ABNORMAL HIGH (ref 0.1–1.0)
Monocytes Relative: 10 % (ref 3–12)
Monocytes Relative: 9 % (ref 3–12)
Neutro Abs: 17.9 10*3/uL — ABNORMAL HIGH (ref 1.7–7.7)
Neutrophils Relative %: 86 % — ABNORMAL HIGH (ref 43–77)
Platelets: 135 10*3/uL — ABNORMAL LOW (ref 150–400)
RBC: 4 MIL/uL (ref 3.87–5.11)
WBC: 20.8 10*3/uL — ABNORMAL HIGH (ref 4.0–10.5)

## 2012-08-21 LAB — URINE MICROSCOPIC-ADD ON

## 2012-08-21 MED ORDER — ONDANSETRON HCL 4 MG PO TABS: 4.0000 mg | ORAL_TABLET | Freq: Four times a day (QID) | ORAL | Status: DC | PRN

## 2012-08-21 MED ORDER — DEXTROSE 5 % IV SOLN
1.0000 g | Freq: Every day | INTRAVENOUS | Status: DC
  Administered 2012-08-21 – 2012-08-23 (×3): 1 g via INTRAVENOUS
  Filled 2012-08-21 (×4): qty 10

## 2012-08-21 MED ORDER — SODIUM CHLORIDE 0.9 % IV SOLN
INTRAVENOUS | Status: AC
  Administered 2012-08-21: 05:00:00 via INTRAVENOUS

## 2012-08-21 MED ORDER — SODIUM CHLORIDE 0.9 % IV SOLN
INTRAVENOUS | Status: AC
  Administered 2012-08-21: 100 mL/h via INTRAVENOUS

## 2012-08-21 MED ORDER — METOPROLOL TARTRATE 100 MG PO TABS
100.0000 mg | ORAL_TABLET | Freq: Two times a day (BID) | ORAL | Status: DC
  Administered 2012-08-21 – 2012-08-24 (×6): 100 mg via ORAL
  Filled 2012-08-21 (×8): qty 1

## 2012-08-21 MED ORDER — DEXTROSE 5 % IV SOLN
1.0000 g | Freq: Once | INTRAVENOUS | Status: AC
  Administered 2012-08-21: 1 g via INTRAVENOUS
  Filled 2012-08-21: qty 10

## 2012-08-21 MED ORDER — ONDANSETRON HCL 4 MG/2ML IJ SOLN
4.0000 mg | Freq: Four times a day (QID) | INTRAMUSCULAR | Status: DC | PRN
  Administered 2012-08-22: 4 mg via INTRAVENOUS
  Filled 2012-08-21: qty 2

## 2012-08-21 MED ORDER — SODIUM CHLORIDE 0.9 % IV BOLUS (SEPSIS)
1000.0000 mL | Freq: Once | INTRAVENOUS | Status: AC
  Administered 2012-08-21: 1000 mL via INTRAVENOUS

## 2012-08-21 MED ORDER — ENOXAPARIN SODIUM 30 MG/0.3ML ~~LOC~~ SOLN
30.0000 mg | SUBCUTANEOUS | Status: DC
  Administered 2012-08-22 – 2012-08-24 (×3): 30 mg via SUBCUTANEOUS
  Filled 2012-08-21 (×4): qty 0.3

## 2012-08-21 NOTE — Plan of Care (Signed)
Problem: Consults Goal: Diabetes Guidelines if Diabetic/Glucose > 140 If diabetic or lab glucose is > 140 mg/dl - Initiate Diabetes/Hyperglycemia Guidelines & Document Interventions  n/a  Problem: Phase I Progression Outcomes Goal: Pain controlled with appropriate interventions n/a

## 2012-08-21 NOTE — ED Notes (Signed)
Admitting MD at bedside.

## 2012-08-21 NOTE — Evaluation (Signed)
Physical Therapy Evaluation Patient Details Name: Brandi [name] MRN: [id number] DOB: Feb 25, 1920 Today's Date: 08/21/2012 Time: 1000-1019 PT Time Calculation (min): 19 min  PT Assessment / Plan / Recommendation Clinical Impression  79 yo female admitted with AMS, weakness, falls. Daughter present during eval. On eval, pt required +2 assist for bed mobility and attempts at standing. Per daughter, pt performs squat-pivot/stand pivot transfers with +1 assist to [facility] or WC. However, last few days pt has been unable. Recommend SNF for continued rehab.     PT Assessment  Patient needs continued PT services    Follow Up Recommendations  SNF    Does the patient have the potential to tolerate intense rehabilitation      Barriers to Discharge        Equipment Recommendations  None recommended by PT    Recommendations for Other Services OT consult   Frequency Min 3X/week    Precautions / Restrictions Precautions Precautions: Fall Restrictions Weight Bearing Restrictions: No   Pertinent Vitals/Pain Pt expresses discomfort at times when legs are moved      Mobility  Bed Mobility Bed Mobility: Supine to Sit;Sit to Supine Supine to Sit: 1: +2 Total assist Supine to Sit: Patient Percentage: 20% Sit to Supine: 1: +2 Total assist Sit to Supine: Patient Percentage: 10% Details for Bed Mobility Assistance: Multimodal cues for technique, hand placement, encouragment to initiate/complete task. Assist for bil LEs and trunk.  Transfers Transfers: Sit to Stand;Stand to Sit Sit to Stand: 1: +2 Total assist Sit to Stand: Patient Percentage: 10% Stand to Sit: 1: +2 Total assist Stand to Sit: Patient Percentage: 10% Details for Transfer Assistance: x 3 attempts-pt only able to barely clear bottom before sitting back down.   Ambulation/Gait Ambulation/Gait Assistance: Not tested (comment) Ambulation/Gait Assistance Details: Non-ambulatory    Exercises     PT Diagnosis: Generalized  weakness  PT Problem List: Decreased strength;Decreased activity tolerance;Decreased mobility;Decreased cognition;Decreased knowledge of use of DME;Decreased knowledge of precautions PT Treatment Interventions: DME instruction;Functional mobility training;Therapeutic activities;Therapeutic exercise;Patient/family education   PT Goals Acute Rehab PT Goals PT Goal Formulation: With family Time For Goal Achievement: 09/04/12 Potential to Achieve Goals: Good Pt will go Supine/Side to Sit: with mod assist PT Goal: Supine/Side to Sit - Progress: Goal set today Pt will go Sit to Supine/Side: with mod assist PT Goal: Sit to Supine/Side - Progress: Goal set today Pt will go Sit to Stand: with mod assist PT Goal: Sit to Stand - Progress: Goal set today Pt will go Stand to Sit: with mod assist PT Goal: Stand to Sit - Progress: Goal set today Pt will Transfer Bed to Chair/Chair to Bed: with mod assist PT Transfer Goal: Bed to Chair/Chair to Bed - Progress: Goal set today  Visit Information  Last PT Received On: 08/21/12 Assistance Needed: +2    Subjective Data  Subjective: Well hello girls... Patient Stated Goal: Back to bed. Family states plan is for SNF   Prior Functioning  Home Living Lives With: Family Available Help at Discharge: Family Home Adaptive Equipment: Walker - rolling;Bedside commode/3-in-1;Wheelchair - manual Prior Function Level of Independence: Needs assistance Needs Assistance: Bathing;Dressing;Grooming;Toileting;Meal Prep;Light Housekeeping;Transfers Bath: Maximal Dressing: Maximal Grooming: Maximal Toileting: Maximal Meal Prep: Total Light Housekeeping: Total Gait Assistance: non-ambulatory Transfer Assistance: +1 assist Able to Take Stairs?: No Driving: No Communication Communication: No difficulties    Cognition  Cognition Arousal/Alertness: Lethargic Behavior During Therapy: WFL for tasks assessed/performed Overall Cognitive Status: History of  cognitive impairments - at baseline  Extremity/Trunk Assessment Right Lower Extremity Assessment RLE ROM/Strength/Tone: Deficits;Unable to fully assess;Due to impaired cognition RLE ROM/Strength/Tone Deficits: Knee ext 3-/5.  Left Lower Extremity Assessment LLE ROM/Strength/Tone: Deficits;Unable to fully assess;Due to impaired cognition LLE ROM/Strength/Tone Deficits: Knee ext 3-/5 Trunk Assessment Trunk Assessment: Kyphotic   Balance Balance Balance Assessed: Yes Static Sitting Balance Static Sitting - Balance Support: Bilateral upper extremity supported Static Sitting - Level of Assistance: 5: Stand by assistance Static Sitting - Comment/# of Minutes: Sat EOB 7-8 minutes total. No LOB  End of Session PT - End of Session Equipment Utilized During Treatment: Gait belt Activity Tolerance: Patient limited by fatigue;Other (comment) (Limited by cognition) Patient left: in bed;with call bell/phone within reach;with bed alarm set;with family/visitor present  GP     Rebeca Alert, MPT Pager: [phone]

## 2012-08-21 NOTE — ED Provider Notes (Signed)
History     CSN: [id number]  Arrival date & time 08/20/12  2336   First MD Initiated Contact with Patient 08/20/12 2338      Chief Complaint  Patient presents with  . Altered Mental Status  . Urinary Incontinence    (Consider location/radiation/quality/duration/timing/severity/associated sxs/prior treatment) HPI 79 year old female presents to emergency department via EMS from home with complaint of increasing confusion, generalized weakness, and urinary incontinence.  Starting on Sunday, family reports she's been increasingly confused.  Tonight she was so weak, she was unable to help with assisting transitioning from bed to chair.  Patient normally does not walk, but is able to stand, and pivot.  Patient fell off commode this evening.  She did not strike her head, no LOC.  She has some bruising to her left hand, but is not complaining of pain.  Failure reports they recently treated yeast infection with Monistat.  That appears to have cleared up per the daughter.  Patient occasionally has urinary incontinence, but it is increased over the last 4 days.  Daughter, reports she's been seeing some blood in her urine.  No fever, chills, no n/v/d, no complaint of abd pain.  She has had decreased appetitie and is not drinking.  Past Medical History  Diagnosis Date  . Atrial fibrillation   . Tachy-brady syndrome   . Digoxin toxicity   . Osteoarthritis   . Anxiety   . HTN (hypertension)   . Orthostasis   . COPD (chronic obstructive pulmonary disease)   . Tricuspid regurgitation   . Edema   . Pacemaker     Past Surgical History  Procedure Laterality Date  . Total hip arthroplasty    . Back surgery  2001  . Pacemaker insertion  2008    History reviewed. No pertinent family history.  History  Substance Use Topics  . Smoking status: Never Smoker   . Smokeless tobacco: Not on file  . Alcohol Use: No    OB History   Grav Para Term Preterm Abortions TAB SAB Ect Mult Living          Review of Systems  Unable to perform ROS: Mental status change    Allergies  Codeine phosphate; Penicillins; and Sulfonamide derivatives  Home Medications   Current Outpatient Rx  Name  Route  Sig  Dispense  Refill  . ALPRAZolam (XANAX) 0.25 MG tablet   Oral   Take 0.25 mg by mouth at bedtime as needed for sleep.         . metoprolol (LOPRESSOR) 100 MG tablet   Oral   Take 1 tablet (100 mg total) by mouth 2 (two) times daily.   180 tablet   4   . vitamin C (ASCORBIC ACID) 500 MG tablet   Oral   Take 1,000 mg by mouth daily.           BP 175/96  Pulse 72  Temp(Src) 98.1 F (36.7 C) (Oral)  Resp 18  SpO2 97%  Physical Exam  Nursing note and vitals reviewed. Constitutional: She appears well-developed and well-nourished. No distress.  HENT:  Head: Normocephalic and atraumatic.  Nose: Nose normal.  Mouth/Throat: Oropharynx is clear and moist. No oropharyngeal exudate.  Eyes: Pupils are equal, round, and reactive to light.  Neck: Normal range of motion. Neck supple. No JVD present. No tracheal deviation present. No thyromegaly present.  Cardiovascular: Normal heart sounds and intact distal pulses.  Exam reveals no gallop and no friction rub.   No murmur heard.  Irregular rate, irregular rhythm  Pulmonary/Chest: Effort normal and breath sounds normal. No stridor. No respiratory distress. She has no wheezes. She has no rales. She exhibits no tenderness.  Abdominal: Soft. Bowel sounds are normal. She exhibits no distension and no mass. There is no tenderness. There is no rebound and no guarding.  Musculoskeletal: She exhibits edema and tenderness.  Diffuse edema and tenderness to legs  Lymphadenopathy:    She has no cervical adenopathy.  Neurological: She is alert. No cranial nerve deficit. She exhibits normal muscle tone. Coordination normal.  Orient to self, that she is in [facility] ED.  Does not know month, year, date, president  Skin: Skin is warm and dry. No  rash noted. No erythema. No pallor.    ED Course  Procedures (including critical care time)  Labs Reviewed  URINALYSIS, ROUTINE W REFLEX MICROSCOPIC - Abnormal; Notable for the following:    APPearance CLOUDY (*)    Hgb urine dipstick SMALL (*)    Protein, ur 30 (*)    Nitrite POSITIVE (*)    Leukocytes, UA TRACE (*)    All other components within normal limits  CBC WITH DIFFERENTIAL - Abnormal; Notable for the following:    WBC 13.6 (*)    Platelets 135 (*)    Neutrophils Relative 87 (*)    Neutro Abs 11.9 (*)    Lymphocytes Relative 4 (*)    Lymphs Abs 0.5 (*)    Monocytes Absolute 1.2 (*)    All other components within normal limits  BASIC METABOLIC PANEL - Abnormal; Notable for the following:    Sodium 122 (*)    Chloride 89 (*)    Glucose, Bld 211 (*)    Creatinine, Ser 1.51 (*)    GFR calc non Af Amer 29 (*)    GFR calc Af Amer 34 (*)    All other components within normal limits  URINE MICROSCOPIC-ADD ON - Abnormal; Notable for the following:    Bacteria, UA MANY (*)    All other components within normal limits   No results found.   Date: 08/21/2012  Rate: 75  Rhythm: premature ventricular contractions (PVC) and ventricular paced rhythm  QRS Axis: left  Intervals: normal  ST/T Wave abnormalities: normal  Conduction Disutrbances:none  Narrative Interpretation:   Old EKG Reviewed: unchanged    1. Weakness generalized   2. Hyponatremia   3. Fall at home, initial encounter   4. Confusion   5. Urinary tract infection   6. Renal insufficiency   7. Dehydration       MDM  79 yo female with generalized weakness, urinary incontinence, confusion over 3-4 days.  Suspect UTI.  Will get baseline labs, in and out cath ua.  Expect admission given her confusion and weakness as daughter is having difficulty caring for her.       Olivia Mackie, MD 08/21/12 [phone]

## 2012-08-21 NOTE — H&P (Signed)
History and Physical  Brandi [name] ZOX:[id number] DOB: [birth year] DOA: 08/20/2012  Referring physician: Dr Norlene Campbell PCP: Lorenda Peck, MD   Chief Complaint: Confusion  HPI:  79 year old female with PMH most significant for sick sinus syndrome, COPD, HTN, osteoartritis and anxiety who presented to emergency department via EMS from home with complaint of increasing confusion, generalized weakness, and urinary incontinence. Starting on Sunday, family reports she's been increasingly confused. Tonight she was so weak, she was unable to help with assisting transitioning from bed to chair. Patient normally does not walk, but is able to stand, and pivot. Patient fell off commode this evening. She did not strike her head, no LOC. She has some bruising to her left hand, but is not complaining of pain. Family also reported they recently treated yeast infection. That appears to have cleared up per the daughter. Patient occasionally has urinary incontinence, but it is increased over the last 4 days. Daughter, reports she's been seeing some blood in her urine. No fever, chills, no n/v/d, no complaint of abd pain. She has had decreased appetitie and is not drinking.  Patient has been having on and off confusion episodes lately which have increased in frequency. The daughter would like to be admitted in a nursing home if possible as it has become increasingly difficult to take care of her.  Patient's Na was found to be low and her urine had bacteria and was positive for nitrates. Asked by ER to admit the patient for observation.   Review of Systems:  15 point review of system was negative except as noted above.    Past Medical History  Diagnosis Date  . Atrial fibrillation   . Tachy-brady syndrome   . Digoxin toxicity   . Osteoarthritis   . Anxiety   . HTN (hypertension)   . Orthostasis   . COPD (chronic obstructive pulmonary disease)   . Tricuspid regurgitation   . Edema   . Pacemaker      Past Surgical History  Procedure Laterality Date  . Total hip arthroplasty    . Back surgery  2001  . Pacemaker insertion  2008    Social History:  reports that she has never smoked. She does not have any smokeless tobacco history on file. She reports that she does not drink alcohol or use illicit drugs.  Allergies  Allergen Reactions  . Codeine Phosphate     REACTION: unspecified  . Penicillins     REACTION: unspecified  . Sulfonamide Derivatives     History reviewed. No pertinent family history.   Prior to Admission medications   Medication Sig Start Date End Date Taking? Authorizing Provider  ALPRAZolam Prudy Feeler) 0.25 MG tablet Take 0.25 mg by mouth at bedtime as needed for sleep.   Yes Historical Provider, MD  metoprolol (LOPRESSOR) 100 MG tablet Take 1 tablet (100 mg total) by mouth 2 (two) times daily. 07/10/12  Yes Duke Salvia, MD  vitamin C (ASCORBIC ACID) 500 MG tablet Take 1,000 mg by mouth daily.   Yes Historical Provider, MD   Physical Exam: Filed Vitals:   08/20/12 2337  BP: 175/96  Pulse: 72  Temp: 98.1 F (36.7 C)  TempSrc: Oral  Resp: 18  SpO2: 97%   Constitutional: She appears well-developed and well-nourished. No distress.  HENT: Head: Normocephalic and atraumatic. Nose: Nose normal. Mouth/Throat: Oropharynx is clear and moist. No oropharyngeal exudate.  Eyes: Pupils are equal, round, and reactive to light.  Neck: Normal range of motion. Neck supple.  No JVD present. No tracheal deviation present. No thyromegaly present.  Cardiovascular: Normal heart sounds and intact distal pulses. Exam reveals no gallop and no friction rub.  No murmur heard.Irregular rate, irregular rhythm  Pulmonary/Chest: Effort normal and breath sounds normal. No stridor. No respiratory distress. She has no wheezes. She has no rales. She exhibits no tenderness.  Abdominal: Soft. Bowel sounds are normal. She exhibits no distension and no mass. There is no tenderness. There is no  rebound and no guarding.  Musculoskeletal: She exhibits edema and tenderness. Diffuse edema and tenderness to legs  Lymphadenopathy:  She has no cervical adenopathy.  Neurological: She is alert. No cranial nerve deficit. She exhibits normal muscle tone. Coordination normal.  Orient to self, that she is in [facility] ED. Does not know month, year, date, president  Skin: Skin is warm and dry. No rash noted. No erythema. No pallor.    Wt Readings from Last 3 Encounters:  07/10/12 165 lb (74.844 kg)  12/06/11 181 lb 12.8 oz (82.464 kg)  01/17/11 160 lb (72.576 kg)    Labs on Admission:  Basic Metabolic Panel:  Recent Labs Lab 08/21/12 0015  NA 122*  K 4.4  CL 89*  CO2 26  GLUCOSE 211*  BUN 22  CREATININE 1.51*  CALCIUM 9.5    CBC:  Recent Labs Lab 08/21/12 0015  WBC 13.6*  NEUTROABS 11.9*  HGB 13.4  HCT 39.2  MCV 98.0  PLT 135*     Radiological Exams on Admission: No results found.  EKG: Independently reviewed. Paced rhythm   Principal Problem:   Altered mental state Active Problems:   HYPERTENSION   FIBRILLATION, ATRIAL   COPD   OSTEOARTHRITIS, GENERALIZED   Fall at home   Hyponatremia   Weakness generalized   UTI (lower urinary tract infection)   Assessment/Plan Patient is 79 year old female with multiple medical problems who is here today with altered mental state which is most Likeey 2/2 UTI but also exacerbated by hyponatremia and dehydration which I think are secondary to UTI.  #1 Altered mental state: Likely metabolic encephalopathy from UTI but dehydration and hyponatremia are contributory findings -treat UTI -IVF NS -haldol PRN for agitation  #2 UTI: Allergic to penicillin -rocephin -Follow urine culture  #3Hyponatremia: Already had hyponatremia with baseline around 130. Replete saline and repeat BNP in AM  #4 gen weakness: From #1 and #2 -PT/OT  Rest of the medical management as per home meds.    Code Status: full Family  Communication: updated at bedside Disposition Plan/Anticipated LOS: Patient to be placed in nursing home when stable  Time spent: 50 minutes  Lars Mage, MD  [facility] Team 5  If 7PM-7AM, please contact night-coverage at [email or web address], password [facility] 08/21/2012, 1:40 AM

## 2012-08-21 NOTE — Plan of Care (Signed)
Problem: Phase I Progression Outcomes Goal: Voiding-avoid urinary catheter unless indicated N/A. Pt unable to void on the bedpan, voiding incont several times and is unable to ask for the bedpan. Foley cath inserted.

## 2012-08-21 NOTE — Progress Notes (Addendum)
[facility] PROGRESS NOTE  Brandi [name]:[id number] DOB: Sep 09, 1919 DOA: 08/20/2012 PCP: Lorenda Peck, MD  Brief narrative: Addendum to admission note Patient is a 79 year old female with past medical history of CAD and pacemaker placement, atrial fibrillation, hypertension who was brought to [facility] ED 08/20/2012 for worsening mental status changes and a fall at home. Patient is not a good historian due to to altered mental status but her daughter says he has been increasingly difficult to take care of her mother at home. There was no reports of fever or chills. No respiratory distress. In ED, patient was hemodynamically stable with blood pressure 150/90. Her CBC revealed white blood cell count of 13.6 and platelet count of 135. Her BMET revealed a sodium level of 122 and creatinine elevated at 1.51.  Assessment/Plan:  Principal Problem:   Acute encephalopathy - Perhaps secondary to urinary tract infection versus worsening dementia, delirium - We will continue antibiotics for now and hopefully mental status improves - Continue IV fluids Active Problems:   Weakness generalized and fall at home -  Patient will need physical therapy evaluation for skilled nursing facility placement   Hyponatremia - Secondary to dehydration - Improving with IV fluids, sodium trended up from 122-127   UTI (lower urinary tract infection) - Continue Rocephin - Follow up urine culture results   Leukocytosis, unspecified -  Likely secondary to urinary tract infection - continue rocephin   Acute kidney failure - secondary to dehydration and acute kidney failure - creatinine already trending down from 1.51 to 1.43 - Improving with IV   Essential hypertension, benign - Continue metoprolol   FIBRILLATION, ATRIAL - Rate controlled with metoprolol   Code Status: DNR/DNI Family Communication: daughter [name] at bedside [phone] or [phone]) Disposition Plan: to SNF when stable  Manson Passey, MD  [facility] Pager [phone]  If 7PM-7AM, please contact night-coverage [email or web address] Password [email or web address] 08/21/2012, 11:01 AM   LOS: 1 day   Consultants:  PT evaluation  Social work for NH placement  Procedures:  None   Antibiotics:  Rocephin 08/20/2012 -->  HPI/Subjective: No acute events since admission  Objective: Filed Vitals:   08/20/12 2337 08/21/12 0349 08/21/12 0417  BP: 175/96 177/64 150/90  Pulse: 72 93 86  Temp: 98.1 F (36.7 C)  98.4 F (36.9 C)  TempSrc: Oral  Axillary  Resp: 18 18 18   Height:   5\' 9"  (1.753 m)  Weight:   83.2 kg (183 lb 6.8 oz)  SpO2: 97% 96% 96%    Intake/Output Summary (Last 24 hours) at 08/21/12 1101 Last data filed at 08/21/12 0900  Gross per 24 hour  Intake   1000 ml  Output      2 ml  Net    998 ml    Exam:   General:  Pt is not in acute distress  Cardiovascular: irregular rate and rhythm, S1/S2 appreciated  Respiratory: Clear to auscultation bilaterally, no wheezing  Abdomen: Soft, non tender, non distended, bowel sounds present, no guarding  Extremities: No edema, pulses DP and PT palpable bilaterally  Neuro: Grossly nonfocal  Data Reviewed: Basic Metabolic Panel:  Recent Labs Lab 08/21/12 0015 08/21/12 0540  NA 122* 127*  K 4.4 4.3  CL 89* 92*  CO2 26 27  GLUCOSE 211* 175*  BUN 22 20  CREATININE 1.51* 1.43*  CALCIUM 9.5 9.3   CBC:  Recent Labs Lab 08/21/12 0015 08/21/12 0540  WBC 13.6* 20.8*  NEUTROABS 11.9* 17.9*  HGB 13.4 14.2  HCT 39.2  39.7  MCV 98.0 97.5  PLT 135* 134*    Studies: No results found.  Scheduled Meds: . cefTRIAXone   1 g Intravenous QHS  . enoxaparin (LOVENOX)   30 mg Subcutaneous Q24H  . metoprolol  100 mg Oral BID   Continuous Infusions: . sodium chloride 100 mL/hr at 08/21/12 0518

## 2012-08-21 NOTE — Plan of Care (Signed)
Problem: Phase I Progression Outcomes Goal: OOB as tolerated unless otherwise ordered Outcome: Progressing Pt will be able to get out of bed into chair or wheelchair with assistance.

## 2012-08-22 DIAGNOSIS — N390 Urinary tract infection, site not specified: Principal | ICD-10-CM

## 2012-08-22 DIAGNOSIS — N179 Acute kidney failure, unspecified: Secondary | ICD-10-CM

## 2012-08-22 DIAGNOSIS — G9340 Encephalopathy, unspecified: Secondary | ICD-10-CM

## 2012-08-22 LAB — CBC
HCT: 35.9 % — ABNORMAL LOW (ref 36.0–46.0)
Hemoglobin: 12.9 g/dL (ref 12.0–15.0)
MCV: 98.4 fL (ref 78.0–100.0)
RBC: 3.65 MIL/uL — ABNORMAL LOW (ref 3.87–5.11)
WBC: 18.2 10*3/uL — ABNORMAL HIGH (ref 4.0–10.5)

## 2012-08-22 LAB — BASIC METABOLIC PANEL
BUN: 20 mg/dL (ref 6–23)
CO2: 24 mEq/L (ref 19–32)
Chloride: 96 mEq/L (ref 96–112)
Creatinine, Ser: 1.62 mg/dL — ABNORMAL HIGH (ref 0.50–1.10)
GFR calc Af Amer: 31 mL/min — ABNORMAL LOW (ref >90)
Glucose, Bld: 141 mg/dL — ABNORMAL HIGH (ref 70–99)
Potassium: 3.9 mEq/L (ref 3.5–5.1)

## 2012-08-22 NOTE — Progress Notes (Signed)
Clinical Social Work Department BRIEF PSYCHOSOCIAL ASSESSMENT 08/22/2012  Patient:  Brandi [name], Brandi [name]     Account Number:  [id number]     Admit date:  08/20/2012  Clinical Social Worker:  Jacelyn Grip  Date/Time:  08/22/2012 11:30 AM  Referred by:  Physician  Date Referred:  08/22/2012 Referred for  SNF Placement   Other Referral:   Interview type:  Patient Other interview type:   and patient daughter at bedside    PSYCHOSOCIAL DATA Living Status:  FAMILY Admitted from facility:   Level of care:   Primary support name:  Brandi [name]/[phone] Primary support relationship to patient:  CHILD, ADULT Degree of support available:   strong    CURRENT CONCERNS Current Concerns  Post-Acute Placement   Other Concerns:    SOCIAL WORK ASSESSMENT / PLAN CSW received referral for New SNF.    CSW met with pt and pt daughter at bedside. Pt daughter reports that pt has had some confusion secondary to UTI. Pt daughter states that pt lives with her and pt daughter has been caring for pt. CSW discussed recommendation for ST rehab at [facility]. Pt daughter agreeable.    CSW completed FL2 and initiated SNF search to [facility].    CSW to follow up with pt and pt daughter re: bed offers.    CSW to continue to follow and facilitate pt discharge needs when pt medically stable for discharge.   Assessment/plan status:  Psychosocial Support/Ongoing Assessment of Needs Other assessment/ plan:   discharge planning   Information/referral to community resources:   [facility] list.    PATIENT'S/FAMILY'S RESPONSE TO PLAN OF CARE: Pt oriented to person, but displayed confusion during assessment. Pt daughter reports that pt is more clear mentally today. Pt daughter very supportive and actively involved in care and hopeful to be able to make good decision about SNF as she is not familiar with SNFs in the area.       Brandi [name], MSW, LCSWA  Clinical Social  Work [phone]

## 2012-08-22 NOTE — Progress Notes (Addendum)
Clinical Social Work Department CLINICAL SOCIAL WORK PLACEMENT NOTE 08/22/2012  Patient:  Brandi [name], Brandi [name]  Account Number:  [id number] Admit date:  08/20/2012  Clinical Social Worker:  Jacelyn Grip  Date/time:  08/22/2012 11:40 AM  Clinical Social Work is seeking post-discharge placement for this patient at the following level of care:   SKILLED NURSING   (*CSW will update this form in Epic as items are completed)   08/22/2012  Patient/family provided with [facility] Department of Clinical Social Work's list of facilities offering this level of care within the geographic area requested by the patient (or if unable, by the patient's family).  08/22/2012  Patient/family informed of their freedom to choose among providers that offer the needed level of care, that participate in Medicare, Medicaid or managed care program needed by the patient, have an available bed and are willing to accept the patient.  08/22/2012  Patient/family informed of [facility]' ownership interest in [facility], as well as of the fact that they are under no obligation to receive care at this facility.  PASARR submitted to EDS on 08/22/2012 PASARR number received from EDS on 08/22/2012  FL2 transmitted to all facilities in geographic area requested by pt/family on  08/22/2012 FL2 transmitted to all facilities within larger geographic area on   Patient informed that his/her managed care company has contracts with or will negotiate with  certain facilities, including the following:     Patient/family informed of bed offers received:  08/22/2012 Patient chooses bed at [facility] Physician recommends and patient chooses bed at    Patient to be transferred to  on  [facility] on 08/24/2012 Patient to be transferred to facility by ambulance Sharin Mons)  The following physician request were entered in Epic:   Additional Comments: Jacklynn Lewis, MSW,  LCSWA  Clinical Social Work [phone]

## 2012-08-22 NOTE — Progress Notes (Signed)
[facility] PROGRESS NOTE  Brandi [name] ZOX:[id number] DOB: [birth year] DOA: 08/20/2012 PCP: Lorenda Peck, MD  Brief narrative: Patient is a 79 year old female with past medical history of CAD and pacemaker placement, atrial fibrillation, hypertension who was brought to [facility] ED 08/20/2012 for worsening mental status changes and a fall at home. Patient is not a good historian due to to altered mental status but her daughter says he has been increasingly difficult to take care of her mother at home. There was no reports of fever or chills. No respiratory distress.  In ED, patient was hemodynamically stable with blood pressure 150/90. Her CBC revealed white blood cell count of 13.6 and platelet count of 135. Her BMET revealed a sodium level of 122 and creatinine elevated at 1.51.   Assessment/Plan:   Principal Problem:  Acute encephalopathy  - Perhaps secondary to urinary tract infection versus worsening dementia, delirium. Per family, patient's mental status is improving. - We will continue antibiotics, rocephin. Follow up urine culture results - Continue IV fluids  Active Problems:  Weakness generalized and fall at home  - SW assisting d/c plan to SNF  Hyponatremia  - Secondary to dehydration  - Improving with IV fluids, sodium trended up from 122 to 129 UTI (lower urinary tract infection)  - Continue Rocephin  - Follow up urine culture results  Leukocytosis, unspecified  - Likely secondary to urinary tract infection  - continue rocephin  - WBC count trending down Acute kidney failure  - secondary to dehydration and acute kidney failure  - continue to monitor renal function - continue IV fluids  Essential hypertension, benign  - Continue metoprolol  FIBRILLATION, ATRIAL  - Rate controlled with metoprolol    Code Status: DNR/DNI  Family Communication: daughter [name] at bedside [phone] or [phone])  Disposition Plan: to SNF when stable   Manson Passey, MD  [facility]   Pager [phone]    Consultants:  PT evaluation  Social work for NH placement Procedures:  None  Antibiotics:  Rocephin 08/20/2012 -->   If 7PM-7AM, please contact night-coverage [email or web address] Password [email or web address] 08/22/2012, 10:20 AM   LOS: 2 days    HPI/Subjective: No acute overnight events.  Objective: Filed Vitals:   08/21/12 1420 08/21/12 2131 08/21/12 2221 08/22/12 0620  BP: 166/104 149/56 149/56 142/68  Pulse: 104 84 86 68  Temp: 98.2 F (36.8 C)  100.9 F (38.3 C) 98.8 F (37.1 C)  TempSrc: Oral  Oral Axillary  Resp: 18  16 16   Height:      Weight:      SpO2: 92%  94% 96%    Intake/Output Summary (Last 24 hours) at 08/22/12 1020 Last data filed at 08/22/12 4696  Gross per 24 hour  Intake      0 ml  Output    503 ml  Net   -503 ml    Exam:   General:  Pt is sleeping, not in acute distress  Cardiovascular: irregular rhythm, rate controlled  Respiratory: Clear to auscultation bilaterally, no wheezing, no crackles, no rhonchi  Abdomen: Soft, non tender, non distended, bowel sounds present, no guarding  Extremities: trace LE edema, pulses DP and PT palpable bilaterally  Neuro: Grossly nonfocal  Data Reviewed: Basic Metabolic Panel:  Recent Labs Lab 08/21/12 0015 08/21/12 0540 08/22/12 0811  NA 122* 127* 129*  K 4.4 4.3 3.9  CL 89* 92* 96  CO2 26 27 24   GLUCOSE 211* 175* 141*  BUN 22 20 20   CREATININE 1.51* 1.43* 1.62*  CALCIUM 9.5 9.3 8.6   CBC:  Recent Labs Lab 08/21/12 0015 08/21/12 0540 08/22/12 0811  WBC 13.6* 20.8* 18.2*  NEUTROABS 11.9* 17.9*  --   HGB 13.4 14.2 12.9  HCT 39.2 39.7 35.9*  MCV 98.0 97.5 98.4  PLT 135* 134* 107*    Studies: No results found.  Scheduled Meds: . cefTRIAXone (ROCEPHIN)  IV  1 g Intravenous QHS  . enoxaparin (LOVENOX) injection  30 mg Subcutaneous Q24H  . metoprolol  100 mg Oral BID

## 2012-08-23 LAB — BASIC METABOLIC PANEL
BUN: 21 mg/dL (ref 6–23)
Creatinine, Ser: 1.48 mg/dL — ABNORMAL HIGH (ref 0.50–1.10)
GFR calc non Af Amer: 30 mL/min — ABNORMAL LOW (ref >90)
Glucose, Bld: 140 mg/dL — ABNORMAL HIGH (ref 70–99)
Potassium: 3.8 mEq/L (ref 3.5–5.1)

## 2012-08-23 LAB — CBC
HCT: 35 % — ABNORMAL LOW (ref 36.0–46.0)
Hemoglobin: 12.3 g/dL (ref 12.0–15.0)
MCH: 34.4 pg — ABNORMAL HIGH (ref 26.0–34.0)
MCHC: 35.1 g/dL (ref 30.0–36.0)
MCV: 97.8 fL (ref 78.0–100.0)
RDW: 13.2 % (ref 11.5–15.5)

## 2012-08-23 LAB — URINE CULTURE

## 2012-08-23 MED ORDER — NITROFURANTOIN MONOHYD MACRO 100 MG PO CAPS: 100.0000 mg | ORAL_CAPSULE | Freq: Two times a day (BID) | ORAL | Status: AC

## 2012-08-23 MED ORDER — ALPRAZOLAM 0.25 MG PO TABS: 0.2500 mg | ORAL_TABLET | Freq: Every evening | ORAL | Status: AC | PRN

## 2012-08-23 NOTE — Progress Notes (Signed)
Physical Therapy Treatment Patient Details Name: Brandi [name] MRN: [id number] DOB: Jul 11, 1919 Today's Date: 08/23/2012 Time: 4098-1191 PT Time Calculation (min): 33 min  PT Assessment / Plan / Recommendation Comments on Treatment Session  Improved mobility this session however pt still requires +2 assist. continue to recommend snf    Follow Up Recommendations  SNF     Does the patient have the potential to tolerate intense rehabilitation     Barriers to Discharge        Equipment Recommendations  None recommended by PT    Recommendations for Other Services    Frequency Min 3X/week   Plan Discharge plan remains appropriate    Precautions / Restrictions Precautions Precautions: Fall Restrictions Weight Bearing Restrictions: No   Pertinent Vitals/Pain No c/o pain    Mobility  Bed Mobility Bed Mobility: Supine to Sit Supine to Sit: 3: Mod assist;HOB elevated Details for Bed Mobility Assistance: Increased time. Multimodal cues for technique, hand placement, initiation/completion of task. assist for trunk and LEs. Transfers Transfers: Stand to Sit;Sit to Stand;Stand Pivot Transfers Sit to Stand: 1: +2 Total assist Sit to Stand: Patient Percentage: 40% Stand to Sit: 1: +2 Total assist Stand to Sit: Patient Percentage: 40% Stand Pivot Transfers: 1: +2 Total assist Stand Pivot Transfers: Patient Percentage: 20% Details for Transfer Assistance: x 2. Assist to rise, stabilize, control descent. Increased assist for pivot-pt has difficulty weightshifting and stepping.  Ambulation/Gait Ambulation/Gait Assistance: Not tested (comment)    Exercises     PT Diagnosis:    PT Problem List:   PT Treatment Interventions:     PT Goals Acute Rehab PT Goals Pt will go Supine/Side to Sit: with mod assist PT Goal: Supine/Side to Sit - Progress: Progressing toward goal Pt will go Sit to Stand: with mod assist PT Goal: Sit to Stand - Progress: Progressing toward goal Pt will go  Stand to Sit: with mod assist PT Goal: Stand to Sit - Progress: Progressing toward goal Pt will Transfer Bed to Chair/Chair to Bed: with mod assist PT Transfer Goal: Bed to Chair/Chair to Bed - Progress: Progressing toward goal  Visit Information  Last PT Received On: 08/23/12 Assistance Needed: +2    Subjective Data  Subjective: I think I have to use the bathroom Patient Stated Goal: SNf for rehab   Cognition  Cognition Arousal/Alertness: Awake/alert Behavior During Therapy: WFL for tasks assessed/performed Overall Cognitive Status: Within Functional Limits for tasks assessed    Balance     End of Session PT - End of Session Equipment Utilized During Treatment: Gait belt Activity Tolerance: Patient tolerated treatment well Patient left: in chair;with call bell/phone within reach;with family/visitor present   GP     Rebeca Alert, MPT Pager: [phone]

## 2012-08-23 NOTE — Progress Notes (Signed)
CSW received phone call from pt daughter, [name].  Pt daughter reports that she has toured facilities this morning and chooses [facility] and [address].  CSW spoke to [facility] who confirmed that they could accept pt tomorrow, Saturday 08/24/2012 and pt daughter was going to complete admission paperwork today.   CSW notified MD.  Weekend CSW to facilitate pt discharge needs to [facility] on Saturday 08/24/2012.  Jacklynn Lewis, MSW, LCSWA  Clinical Social Work [phone]

## 2012-08-23 NOTE — Discharge Summary (Addendum)
Physician Discharge Summary  Brandi [name] AVW:[id number] DOB: Sep 09, 1919 DOA: 08/20/2012  PCP: Lorenda Peck, MD  Admit date: 08/20/2012 Discharge date: 08/23/2012  Recommendations for Outpatient Follow-up:  1. Pt will need to follow up with primary provided in 1-2 weeks post discharge 2. Please check BMP to ensure that Creatinine is continuing to trend towards pt's baseline, please also check sodium level  3. Please also check CBC, to follow up on Hg, WBC, platelets 4. Please note that urine culture during the hospital stay was positive for citrobacter species pan sensitive 5. Pt will be discharge on antibiotic Macrobid for 7 more days post discharge   Discharge Diagnoses: Acute encephalopathy secondary ti UTI  Principal Problem:   Acute encephalopathy Active Problems:   Weakness generalized   FIBRILLATION, ATRIAL   Fall at home   Hyponatremia   UTI (lower urinary tract infection)   Leukocytosis, unspecified   Acute kidney failure   Essential hypertension, benign  Discharge Condition: Stable  Diet recommendation: Regular diet as pt able to tolerate   History of present illness:  Patient is a 79 year old female with past medical history of CAD and pacemaker placement, atrial fibrillation, hypertension who was brought to [facility] ED 08/20/2012 for worsening mental status changes and a fall at home. Patient is not a good historian due to to altered mental status but her daughter says he has been increasingly difficult to take care of her mother at home. There was no reports of fever or chills. No respiratory distress.   In ED, patient was hemodynamically stable with blood pressure 150/90. Her CBC revealed white blood cell count of 13.6 and platelet count of 135. Her BMET revealed a sodium level of 122 and creatinine elevated at 1.51.   Assessment/Plan:  Principal Problem:  Acute encephalopathy  - secondary to urinary tract infection in the setting of worsening dementia,  delirium. Per family, patient's mental status is improving, nearly her baseline  - urine culture positive for citrobacter species pan sensitive, will change ABX to Macrobid for 7 more days post discharge  Active Problems:  Weakness generalized and fall at home  - SW assisting d/c plan to SNF likely in AM to [facility] Hyponatremia  - Secondary to dehydration  - Improving with IV fluids, sodium trended up from 122 to 129  - this can be followed up in an outpatient setting - pt tolerating PO intake UTI (lower urinary tract infection)  - urine culture positive for citrobacter species pan sensitive, will change ABX to Macrobid Leukocytosis, unspecified  - secondary to urinary tract infection, ABX as noted above  - WBC count trending down  Acute kidney failure  - secondary to dehydration and acute kidney failure  - provided IVF and pt responded well and we were able to discontinue IVF and encourage continuation of PO intake  Essential hypertension, benign  - Continue metoprolol  FIBRILLATION, ATRIAL  - Rate controlled with metoprolol   Code Status: DNR/DNI  Family Communication: daughter [name] at bedside [phone] or [phone])  Disposition Plan: to SNF [facility]  Manson Passey, MD  [facility]  Pager [phone]   Consultants:  PT evaluation  Social work for NH placement Procedures:  None  Antibiotics:  Rocephin 08/20/2012 --> 4/25 Bactrim 4/25 --> for 7 more days post discharge   Discharge Exam: Filed Vitals:   08/23/12 1033  BP: 126/62  Pulse: 88  Temp:   Resp:    Filed Vitals:   08/22/12 1351 08/22/12 2107 08/23/12 0600 08/23/12 1033  BP: 146/70 131/62 102/69 126/62  Pulse: 88 87 84 88  Temp: 98 F (36.7 C) 98.5 F (36.9 C) 99.2 F (37.3 C)   TempSrc:  Oral Oral   Resp: 18 16 18    Height:      Weight:      SpO2: 94% 95% 98%    General: Pt is more alert this AM and follows some commands appropriately, not in acute distress  Cardiovascular: irregular rhythm, rate  controlled  Respiratory: Clear to auscultation bilaterally, no wheezing, no crackles, no rhonchi  Abdomen: Soft, non tender, non distended, bowel sounds present, no guarding  Extremities: trace LE edema, pulses DP and PT palpable bilaterally  Neuro: Grossly nonfocal  Discharge Instructions   Future Appointments Provider Department Dept Phone   10/14/2012 10:05 AM [facility] [place] [employer] Main [facility] [place]) [phone]       Medication List    TAKE these medications       ALPRAZolam 0.25 MG tablet  Commonly known as:  XANAX  Take 1 tablet (0.25 mg total) by mouth at bedtime as needed for sleep.     metoprolol 100 MG tablet  Commonly known as:  LOPRESSOR  Take 1 tablet (100 mg total) by mouth 2 (two) times daily.     Macrobid 100 mg Twice daily for 7 days  vitamin C 500 MG tablet  Commonly known as:  ASCORBIC ACID  Take 1,000 mg by mouth daily.           Follow-up Information   Follow up with ROBERTS, Vernie Ammons, MD In 2 weeks.   Contact information:   [address] [place] [address] [phone]        The results of significant diagnostics from this hospitalization (including imaging, microbiology, ancillary and laboratory) are listed below for reference.    Significant Diagnostic Studies: No results found.  Microbiology: Recent Results (from the past 240 hour(s))  URINE CULTURE     Status: None   Collection Time    08/21/12 12:30 AM      Result Value Range Status   Specimen Description URINE, CATHETERIZED   Final   Special Requests none Normal   Final   Culture  Setup Time 08/21/2012 08:36   Final   Colony Count >=100,000 COLONIES/ML   Final   Culture GRAM NEGATIVE RODS   Final   Report Status PENDING   Incomplete     Labs: Basic Metabolic Panel:  Recent Labs Lab 08/21/12 0015 08/21/12 0540 08/22/12 0811 08/23/12 0755  NA 122* 127* 129* 127*  K 4.4 4.3 3.9 3.8  CL 89* 92* 96 96  CO2 26 27 24 22    GLUCOSE 211* 175* 141* 140*  BUN 22 20 20 21   CREATININE 1.51* 1.43* 1.62* 1.48*  CALCIUM 9.5 9.3 8.6 8.6   CBC:  Recent Labs Lab 08/21/12 0015 08/21/12 0540 08/22/12 0811 08/23/12 0755  WBC 13.6* 20.8* 18.2* 15.1*  NEUTROABS 11.9* 17.9*  --   --   HGB 13.4 14.2 12.9 12.3  HCT 39.2 39.7 35.9* 35.0*  MCV 98.0 97.5 98.4 97.8  PLT 135* 134* 107* 125*   Time coordinating discharge: Over 30 minutes  Signed:  Manson Passey, MD  [facility] 08/23/2012, 11:15 AM  Pager #: [phone]

## 2012-08-24 DIAGNOSIS — Y92009 Unspecified place in unspecified non-institutional (private) residence as the place of occurrence of the external cause: Secondary | ICD-10-CM

## 2012-08-24 DIAGNOSIS — W19XXXA Unspecified fall, initial encounter: Secondary | ICD-10-CM

## 2012-08-24 NOTE — Progress Notes (Signed)
Patient discharged per MD order to SNF.  Report called to [facility] at [facility]. [facility] arrived and transported patient to facility. Angelena Form, RN

## 2012-08-24 NOTE — Progress Notes (Signed)
Patient void 150cc urine on bedpan prior to discharge. Angelena Form, RN

## 2012-08-24 NOTE — Progress Notes (Signed)
[facility] PROGRESS NOTE  Brandi [name] ZOX:[id number] DOB: December 23, 1919 DOA: 08/20/2012 PCP: Lorenda Peck, MD  Pt seen and examined at hte bedside. Patient is stable for discahrge to SNF. Please refer to d/c summary done 08/23/12.  Assessment and Plan: UTI - to continue macrobid 100 mg BID for 7 days   If 7PM-7AM, please contact night-coverage [email or web address] Password [facility] 08/24/2012, 7:20 AM   LOS: 4 days    HPI/Subjective: No acute overnight events.  Objective: Filed Vitals:   08/23/12 1033 08/23/12 1404 08/23/12 2105 08/24/12 0502  BP: 126/62 171/83 180/80 175/82  Pulse: 88 74 90 81  Temp:  97.7 F (36.5 C) 98.3 F (36.8 C) 98.9 F (37.2 C)  TempSrc:  Oral Oral Oral  Resp:  18 16 16   Height:      Weight:      SpO2:  96% 95% 97%    Intake/Output Summary (Last 24 hours) at 08/24/12 0720 Last data filed at 08/24/12 0500  Gross per 24 hour  Intake    480 ml  Output    900 ml  Net   -420 ml    Exam:   General:  Pt is alert, follows commands appropriately, not in acute distress  Cardiovascular:irregular rhythm, rate controlled, S1/S2 apperciated  Respiratory: Clear to auscultation bilaterally, no wheezing, no crackles, no rhonchi  Abdomen: Soft, non tender, non distended, bowel sounds present, no guarding  Extremities: trace edema, pulses DP and PT palpable bilaterally  Neuro: Grossly nonfocal  Data Reviewed: Basic Metabolic Panel:  Recent Labs Lab 08/21/12 0015 08/21/12 0540 08/22/12 0811 08/23/12 0755  NA 122* 127* 129* 127*  K 4.4 4.3 3.9 3.8  CL 89* 92* 96 96  CO2 26 27 24 22   GLUCOSE 211* 175* 141* 140*  BUN 22 20 20 21   CREATININE 1.51* 1.43* 1.62* 1.48*  CALCIUM 9.5 9.3 8.6 8.6   Liver Function Tests: No results found for this basename: AST, ALT, ALKPHOS, BILITOT, PROT, ALBUMIN,  in the last 168 hours No results found for this basename: LIPASE, AMYLASE,  in the last 168 hours No results found for this basename:  AMMONIA,  in the last 168 hours CBC:  Recent Labs Lab 08/21/12 0015 08/21/12 0540 08/22/12 0811 08/23/12 0755  WBC 13.6* 20.8* 18.2* 15.1*  NEUTROABS 11.9* 17.9*  --   --   HGB 13.4 14.2 12.9 12.3  HCT 39.2 39.7 35.9* 35.0*  MCV 98.0 97.5 98.4 97.8  PLT 135* 134* 107* 125*   Cardiac Enzymes: No results found for this basename: CKTOTAL, CKMB, CKMBINDEX, TROPONINI,  in the last 168 hours BNP: No components found with this basename: POCBNP,  CBG: No results found for this basename: GLUCAP,  in the last 168 hours  Recent Results (from the past 240 hour(s))  URINE CULTURE     Status: None   Collection Time    08/21/12 12:30 AM      Result Value Range Status   Specimen Description URINE, CATHETERIZED   Final   Special Requests none Normal   Final   Culture  Setup Time 08/21/2012 08:36   Final   Colony Count >=100,000 COLONIES/ML   Final   Culture CITROBACTER BRAAKII   Final   Report Status 08/23/2012 FINAL   Final   Organism ID, Bacteria CITROBACTER BRAAKII   Final     Studies: No results found.  Scheduled Meds: . cefTRIAXone (ROCEPHIN)  IV  1 g Intravenous QHS  . enoxaparin (LOVENOX) injection  30 mg  Subcutaneous Q24H  . metoprolol  100 mg Oral BID   Continuous Infusions:

## 2012-08-24 NOTE — Progress Notes (Signed)
Per MD, Pt ready for d/c.  Notified RN, Pt, family and facility.  Facility ready to receive Pt.  Arranged for transportation.  Amanda Demetrius Barrell, LCSWA Clinical Social Work [phone]   

## 2012-10-14 ENCOUNTER — Encounter: Payer: Medicare Other | Admitting: *Deleted

## 2012-10-25 ENCOUNTER — Encounter: Payer: Self-pay | Admitting: *Deleted

## 2012-11-13 ENCOUNTER — Encounter: Payer: Medicare Other | Admitting: *Deleted

## 2013-01-02 ENCOUNTER — Telehealth: Payer: Self-pay | Admitting: Internal Medicine

## 2013-01-02 ENCOUNTER — Encounter: Payer: Self-pay | Admitting: Internal Medicine

## 2013-01-07 ENCOUNTER — Encounter: Payer: Self-pay | Admitting: Internal Medicine

## 2013-01-22 ENCOUNTER — Ambulatory Visit (INDEPENDENT_AMBULATORY_CARE_PROVIDER_SITE_OTHER): Payer: Medicare Other | Admitting: *Deleted

## 2013-01-22 DIAGNOSIS — I4891 Unspecified atrial fibrillation: Secondary | ICD-10-CM

## 2013-01-29 LAB — PACEMAKER DEVICE OBSERVATION
BMOD-0003RV: 30
BMOD-0005RV: 95 {beats}/min
BRDY-0002RV: 60 {beats}/min
RV LEAD AMPLITUDE: 11.2 mv
RV LEAD IMPEDENCE PM: 601 Ohm
RV LEAD THRESHOLD: 0.875 V

## 2013-02-06 ENCOUNTER — Encounter: Payer: Self-pay | Admitting: Internal Medicine

## 2013-05-01 DEATH — deceased

## 2013-05-16 ENCOUNTER — Telehealth: Payer: Self-pay

## 2013-05-16 NOTE — Telephone Encounter (Signed)
Patient past away per Obituary in [place] [employer] °

## 2013-11-21 NOTE — Telephone Encounter (Signed)
Close encounter
